# Patient Record
Sex: Female | Born: 1985 | Race: White | Hispanic: No | Marital: Married | Smoking: Never smoker
Health system: Southern US, Community
[De-identification: ages and names within clinical notes are randomized; demographics above are authoritative.]

## PROBLEM LIST (undated history)

## (undated) DIAGNOSIS — Z789 Other specified health status: Secondary | ICD-10-CM

## (undated) DIAGNOSIS — F419 Anxiety disorder, unspecified: Secondary | ICD-10-CM

## (undated) HISTORY — PX: NO PAST SURGERIES: SHX2092

---

## 2013-10-29 NOTE — L&D Delivery Note (Signed)
Delivery Note Patient pushed for 3 hours and at 12:37 AM a viable and healthy female was delivered via Vaginal, Spontaneous Delivery (Presentation: Right Occiput Anterior).  APGAR: 3, 9;  Weight 6lbs 10oz Placenta status: spontaneous, intact , .  Cord: 3 vessels with the following complications: None.  Cord pH: drawn and pending d/t baby being floppy immediately post partum. Peds present At delivery.  Anesthesia: Epidural  Episiotomy: None Lacerations: 1st degree Suture Repair: 2.0 vicryl Est. Blood Loss (mL): 400  Mom to postpartum.  Baby to Couplet care / Skin to Skin.  Katelyn Peters, Katelyn Peters [name] 10/06/2014, 1:05 AM

## 2014-04-02 ENCOUNTER — Other Ambulatory Visit: Payer: Self-pay

## 2014-04-02 LAB — OB RESULTS CONSOLE ABO/RH: RH Type: POSITIVE

## 2014-04-02 LAB — OB RESULTS CONSOLE RUBELLA ANTIBODY, IGM: RUBELLA: IMMUNE

## 2014-04-02 LAB — OB RESULTS CONSOLE HIV ANTIBODY (ROUTINE TESTING): HIV: NONREACTIVE

## 2014-04-02 LAB — OB RESULTS CONSOLE ANTIBODY SCREEN: Antibody Screen: NEGATIVE

## 2014-04-02 LAB — OB RESULTS CONSOLE HEPATITIS B SURFACE ANTIGEN: HEP B S AG: NEGATIVE

## 2014-04-02 LAB — OB RESULTS CONSOLE RPR: RPR: NONREACTIVE

## 2014-04-02 LAB — OB RESULTS CONSOLE GC/CHLAMYDIA
CHLAMYDIA, DNA PROBE: NEGATIVE
Gonorrhea: NEGATIVE

## 2014-05-13 ENCOUNTER — Other Ambulatory Visit (HOSPITAL_COMMUNITY): Payer: Self-pay | Admitting: Obstetrics and Gynecology

## 2014-05-13 DIAGNOSIS — IMO0002 Reserved for concepts with insufficient information to code with codable children: Secondary | ICD-10-CM

## 2014-05-13 DIAGNOSIS — Z0489 Encounter for examination and observation for other specified reasons: Secondary | ICD-10-CM

## 2014-05-21 ENCOUNTER — Ambulatory Visit (HOSPITAL_COMMUNITY)
Admission: RE | Admit: 2014-05-21 | Discharge: 2014-05-21 | Disposition: A | Discharge status: Home / Self Care | Payer: 59 | Source: Ambulatory Visit | Attending: Obstetrics and Gynecology | Admitting: Obstetrics and Gynecology

## 2014-05-21 DIAGNOSIS — IMO0002 Reserved for concepts with insufficient information to code with codable children: Secondary | ICD-10-CM

## 2014-05-21 DIAGNOSIS — Z1389 Encounter for screening for other disorder: Secondary | ICD-10-CM | POA: Insufficient documentation

## 2014-05-21 DIAGNOSIS — Z363 Encounter for antenatal screening for malformations: Secondary | ICD-10-CM | POA: Insufficient documentation

## 2014-05-21 DIAGNOSIS — Z0489 Encounter for examination and observation for other specified reasons: Secondary | ICD-10-CM

## 2014-05-25 ENCOUNTER — Other Ambulatory Visit (HOSPITAL_COMMUNITY): Payer: Self-pay | Admitting: Obstetrics and Gynecology

## 2014-05-25 ENCOUNTER — Encounter (HOSPITAL_COMMUNITY): Payer: Self-pay | Admitting: Obstetrics and Gynecology

## 2014-05-25 DIAGNOSIS — O10912 Unspecified pre-existing hypertension complicating pregnancy, second trimester: Secondary | ICD-10-CM

## 2014-05-25 DIAGNOSIS — O99212 Obesity complicating pregnancy, second trimester: Principal | ICD-10-CM

## 2014-07-02 ENCOUNTER — Ambulatory Visit (HOSPITAL_COMMUNITY): Payer: 59

## 2014-07-06 ENCOUNTER — Ambulatory Visit (HOSPITAL_COMMUNITY)
Admission: RE | Admit: 2014-07-06 | Discharge: 2014-07-06 | Disposition: A | Discharge status: Home / Self Care | Payer: 59 | Source: Ambulatory Visit | Attending: Obstetrics and Gynecology | Admitting: Obstetrics and Gynecology

## 2014-07-06 ENCOUNTER — Encounter (HOSPITAL_COMMUNITY): Payer: Self-pay

## 2014-07-06 DIAGNOSIS — O10912 Unspecified pre-existing hypertension complicating pregnancy, second trimester: Secondary | ICD-10-CM

## 2014-07-06 DIAGNOSIS — O99212 Obesity complicating pregnancy, second trimester: Secondary | ICD-10-CM

## 2014-07-06 DIAGNOSIS — E669 Obesity, unspecified: Secondary | ICD-10-CM | POA: Insufficient documentation

## 2014-07-06 DIAGNOSIS — O99210 Obesity complicating pregnancy, unspecified trimester: Secondary | ICD-10-CM | POA: Diagnosis not present

## 2014-07-06 DIAGNOSIS — Z3689 Encounter for other specified antenatal screening: Secondary | ICD-10-CM | POA: Insufficient documentation

## 2014-07-06 DIAGNOSIS — O10019 Pre-existing essential hypertension complicating pregnancy, unspecified trimester: Secondary | ICD-10-CM | POA: Insufficient documentation

## 2014-08-18 ENCOUNTER — Ambulatory Visit (HOSPITAL_COMMUNITY): Payer: 59

## 2014-08-20 ENCOUNTER — Ambulatory Visit (HOSPITAL_COMMUNITY)
Admission: RE | Admit: 2014-08-20 | Discharge: 2014-08-20 | Disposition: A | Discharge status: Home / Self Care | Payer: 59 | Source: Ambulatory Visit | Attending: Obstetrics and Gynecology | Admitting: Obstetrics and Gynecology

## 2014-08-20 VITALS — BP 118/72 | HR 103 | Wt 256.2 lb

## 2014-08-20 DIAGNOSIS — Z36 Encounter for antenatal screening of mother: Secondary | ICD-10-CM | POA: Insufficient documentation

## 2014-08-20 DIAGNOSIS — O99210 Obesity complicating pregnancy, unspecified trimester: Secondary | ICD-10-CM

## 2014-08-20 DIAGNOSIS — O99213 Obesity complicating pregnancy, third trimester: Secondary | ICD-10-CM | POA: Insufficient documentation

## 2014-08-20 DIAGNOSIS — Z3A32 32 weeks gestation of pregnancy: Secondary | ICD-10-CM | POA: Diagnosis not present

## 2014-08-20 DIAGNOSIS — IMO0002 Reserved for concepts with insufficient information to code with codable children: Secondary | ICD-10-CM

## 2014-08-20 DIAGNOSIS — Z0489 Encounter for examination and observation for other specified reasons: Secondary | ICD-10-CM

## 2014-08-31 ENCOUNTER — Encounter (HOSPITAL_COMMUNITY): Payer: Self-pay

## 2014-09-09 LAB — OB RESULTS CONSOLE GBS: GBS: NEGATIVE

## 2014-09-21 ENCOUNTER — Inpatient Hospital Stay (HOSPITAL_COMMUNITY)
Admission: AD | Admit: 2014-09-21 | Discharge: 2014-09-21 | Disposition: A | Discharge status: Home / Self Care | Payer: 59 | Source: Ambulatory Visit | Attending: Obstetrics and Gynecology | Admitting: Obstetrics and Gynecology

## 2014-09-21 ENCOUNTER — Encounter (HOSPITAL_COMMUNITY): Payer: Self-pay | Admitting: General Practice

## 2014-09-21 DIAGNOSIS — O133 Gestational [pregnancy-induced] hypertension without significant proteinuria, third trimester: Secondary | ICD-10-CM | POA: Diagnosis present

## 2014-09-21 DIAGNOSIS — Z3A37 37 weeks gestation of pregnancy: Secondary | ICD-10-CM | POA: Diagnosis not present

## 2014-09-21 HISTORY — DX: Anxiety disorder, unspecified: F41.9

## 2014-09-21 HISTORY — DX: Other specified health status: Z78.9

## 2014-09-21 LAB — PROTEIN / CREATININE RATIO, URINE
CREATININE, URINE: 43.52 mg/dL
PROTEIN CREATININE RATIO: 0.09 (ref 0.00–0.15)
Total Protein, Urine: 4 mg/dL

## 2014-09-21 LAB — COMPREHENSIVE METABOLIC PANEL
ALBUMIN: 2.5 g/dL — AB (ref 3.5–5.2)
ALT: 16 U/L (ref 0–35)
ANION GAP: 14 (ref 5–15)
AST: 18 U/L (ref 0–37)
Alkaline Phosphatase: 148 U/L — ABNORMAL HIGH (ref 39–117)
BUN: 10 mg/dL (ref 6–23)
CALCIUM: 8.7 mg/dL (ref 8.4–10.5)
CO2: 19 mEq/L (ref 19–32)
Chloride: 104 mEq/L (ref 96–112)
Creatinine, Ser: 0.63 mg/dL (ref 0.50–1.10)
GFR calc non Af Amer: >90 mL/min (ref >90)
GLUCOSE: 74 mg/dL (ref 70–99)
Potassium: 4 mEq/L (ref 3.7–5.3)
Sodium: 137 mEq/L (ref 137–147)
TOTAL PROTEIN: 6.7 g/dL (ref 6.0–8.3)
Total Bilirubin: <0.2 mg/dL — ABNORMAL LOW (ref 0.3–1.2)

## 2014-09-21 LAB — CBC
HEMATOCRIT: 36 % (ref 36.0–46.0)
HEMOGLOBIN: 11.5 g/dL — AB (ref 12.0–15.0)
MCH: 26.4 pg (ref 26.0–34.0)
MCHC: 31.9 g/dL (ref 30.0–36.0)
MCV: 82.6 fL (ref 78.0–100.0)
Platelets: 275 10*3/uL (ref 150–400)
RBC: 4.36 MIL/uL (ref 3.87–5.11)
RDW: 16.2 % — AB (ref 11.5–15.5)
WBC: 7.9 10*3/uL (ref 4.0–10.5)

## 2014-09-21 LAB — URINALYSIS, ROUTINE W REFLEX MICROSCOPIC
BILIRUBIN URINE: NEGATIVE
GLUCOSE, UA: NEGATIVE mg/dL
Hgb urine dipstick: NEGATIVE
KETONES UR: NEGATIVE mg/dL
LEUKOCYTES UA: NEGATIVE
NITRITE: NEGATIVE
PH: 6 (ref 5.0–8.0)
Protein, ur: NEGATIVE mg/dL
Specific Gravity, Urine: <1.005 — ABNORMAL LOW (ref 1.005–1.030)
Urobilinogen, UA: 0.2 mg/dL (ref 0.0–1.0)

## 2014-09-21 NOTE — Discharge Instructions (Signed)

## 2014-09-21 NOTE — MAU Note (Signed)
Patient states she was in the office today and had elevated blood pressure. Sent to MAU for further evaluation. Denies bleeding, leaking or contractions. Reports good fetal movement.

## 2014-09-21 NOTE — MAU Note (Signed)
Urine in lab 

## 2014-09-21 NOTE — MAU Provider Note (Signed)
Chief Complaint:  Hypertension  First Provider Initiated Contact with Patient 09/21/14 1653     HPI: Katelyn Peters is a 28 y.o. G1P0 at 5857w0d who was sent [facility] maternity admissions [facility] for preeclampsia workup. Blood pressure elevated in the office today. Denies headache, vision changes, epigastric pain, leaking of fluid or vaginal bleeding. Mild contractions. Positive fetal movement.   Pregnancy Course: Obesity, anxiety, BP elevated at NOB, but normal throughout pregnancy until ~this week.  Past Medical History: Past Medical History  Diagnosis Date  . Medical history non-contributory   . Anxiety     Past obstetric history: OB History  Gravida Para Term Preterm AB SAB TAB Ectopic Multiple Living  1         0    # Outcome Date GA Lbr Len/2nd Weight Sex Delivery Anes PTL Lv  1 Current               Past Surgical History: History reviewed. No pertinent past surgical history.   Family History: History reviewed. No pertinent family history.  Social History: History  Substance Use Topics  . Smoking status: Never Smoker   . Smokeless tobacco: Never Used  . Alcohol Use: Yes     Comment: occasional social drinking    Allergies: No Known Allergies  Meds:  Prescriptions prior to admission  Medication Sig Dispense Refill Last Dose  . acetaminophen (TYLENOL) 500 MG tablet Take 1,000 mg by mouth every 6 (six) hours as needed for moderate pain.   09/20/2014 at 2200  . calcium carbonate (TUMS - DOSED IN MG ELEMENTAL CALCIUM) 500 MG chewable tablet Chew 1 tablet by mouth daily as needed for heartburn.   09/20/2014 at 2200  . Prenatal Vit-Fe Fumarate-FA (PRENATAL MULTIVITAMIN) TABS tablet Take 1 tablet by mouth daily at 12 noon.   09/20/2014 at 2200  . sodium chloride (OCEAN) 0.65 % SOLN nasal spray Place 1 spray into both nostrils as needed for congestion.   09/21/2014 at 1300    ROS: Pertinent findings in history of present illness.  Physical Exam  Blood  pressure 109/68, pulse 80, height 5' 3.5" (1.613 m), weight 121.292 kg (267 lb 6.4 oz), last menstrual period 12/30/2013. GENERAL: Well-developed, well-nourished female in no acute distress.  HEENT: normocephalic HEART: normal rate RESP: normal effort ABDOMEN: Soft, non-tender, gravid appropriate for gestational age EXTREMITIES: Nontender, no edema NEURO: alert and oriented SPECULUM EXAM: Deferred     FHT:  Baseline 130 , moderate variability, accelerations present, no decelerations Contractions: irreg, mild   Labs: Results for orders placed or performed during the hospital encounter of 09/21/14 (from the past 24 hour(s))  Urinalysis, Routine w reflex microscopic     Status: Abnormal   Collection Time: 09/21/14  4:15 PM  Result Value Ref Range   Color, Urine YELLOW YELLOW   APPearance HAZY (A) CLEAR   Specific Gravity, Urine <1.005 (L) 1.005 - 1.030   pH 6.0 5.0 - 8.0   Glucose, UA NEGATIVE NEGATIVE mg/dL   Hgb urine dipstick NEGATIVE NEGATIVE   Bilirubin Urine NEGATIVE NEGATIVE   Ketones, ur NEGATIVE NEGATIVE mg/dL   Protein, ur NEGATIVE NEGATIVE mg/dL   Urobilinogen, UA 0.2 0.0 - 1.0 mg/dL   Nitrite NEGATIVE NEGATIVE   Leukocytes, UA NEGATIVE NEGATIVE  Protein / creatinine ratio, urine     Status: None   Collection Time: 09/21/14  4:15 PM  Result Value Ref Range   Creatinine, Urine 43.52 mg/dL   Total Protein, Urine 4 mg/dL  Protein Creatinine Ratio 0.09 0.00 - 0.15  CBC     Status: Abnormal   Collection Time: 09/21/14  5:01 PM  Result Value Ref Range   WBC 7.9 4.0 - 10.5 K/uL   RBC 4.36 3.87 - 5.11 MIL/uL   Hemoglobin 11.5 (L) 12.0 - 15.0 g/dL   HCT 16.136.0 09.636.0 - 04.546.0 %   MCV 82.6 78.0 - 100.0 fL   MCH 26.4 26.0 - 34.0 pg   MCHC 31.9 30.0 - 36.0 g/dL   RDW 40.916.2 (H) 81.111.5 - 91.415.5 %   Platelets 275 150 - 400 K/uL  Comprehensive metabolic panel     Status: Abnormal   Collection Time: 09/21/14  5:01 PM  Result Value Ref Range   Sodium 137 137 - 147 mEq/L   Potassium  4.0 3.7 - 5.3 mEq/L   Chloride 104 96 - 112 mEq/L   CO2 19 19 - 32 mEq/L   Glucose, Bld 74 70 - 99 mg/dL   BUN 10 6 - 23 mg/dL   Creatinine, Ser 7.820.63 0.50 - 1.10 mg/dL   Calcium 8.7 8.4 - 95.610.5 mg/dL   Total Protein 6.7 6.0 - 8.3 g/dL   Albumin 2.5 (L) 3.5 - 5.2 g/dL   AST 18 0 - 37 U/L   ALT 16 0 - 35 U/L   Alkaline Phosphatase 148 (H) 39 - 117 U/L   Total Bilirubin <0.2 (L) 0.3 - 1.2 mg/dL   GFR calc non Af Amer >90 >90 mL/min   GFR calc Af Amer >90 >90 mL/min   Anion gap 14 5 - 15    Imaging:  No results found.   MAU Course:  Assessment: 1. Gestational hypertension, third trimester    Plan: Discharge home in stable condition per consult w/ Dr. Henderson CloudHorvath. Pre-E precautions. Labor precautions and fetal kick counts     Follow-up Information    Follow up with Annemarie Sebree A, MD. Schedule an appointment as soon as possible for a visit in 1 week.   Specialty:  Obstetrics and Gynecology   Why:  for Monday or Tuesday next week    Contact information:   [address] [place] [id number] [phone]       [facility] with [facility].   Why:  As needed in emergencies   Contact information:   [address] [id number] mc [place] [id number] [phone]       Medication List    TAKE these medications        acetaminophen 500 MG tablet  Commonly known as:  TYLENOL  Take 1,000 mg by mouth every 6 (six) hours as needed for moderate pain.     calcium carbonate 500 MG chewable tablet  Commonly known as:  TUMS - dosed in mg elemental calcium  Chew 1 tablet by mouth daily as needed for heartburn.     prenatal multivitamin Tabs tablet  Take 1 tablet by mouth daily at 12 noon.     sodium chloride 0.65 % Soln nasal spray  Commonly known as:  OCEAN  Place 1 spray into both nostrils as needed for congestion.        LyonsVirginia Smith, CNM 09/21/2014 6:40 PM

## 2014-10-01 ENCOUNTER — Inpatient Hospital Stay (HOSPITAL_COMMUNITY)
Admission: AD | Admit: 2014-10-01 | Discharge: 2014-10-01 | Disposition: A | Discharge status: Home / Self Care | Payer: 59 | Source: Ambulatory Visit | Attending: Obstetrics and Gynecology | Admitting: Obstetrics and Gynecology

## 2014-10-01 ENCOUNTER — Encounter (HOSPITAL_COMMUNITY): Payer: Self-pay | Admitting: *Deleted

## 2014-10-01 DIAGNOSIS — O133 Gestational [pregnancy-induced] hypertension without significant proteinuria, third trimester: Secondary | ICD-10-CM

## 2014-10-01 DIAGNOSIS — Z3A38 38 weeks gestation of pregnancy: Secondary | ICD-10-CM | POA: Insufficient documentation

## 2014-10-01 DIAGNOSIS — R030 Elevated blood-pressure reading, without diagnosis of hypertension: Secondary | ICD-10-CM | POA: Diagnosis present

## 2014-10-01 LAB — LACTATE DEHYDROGENASE: LDH: 210 U/L (ref 94–250)

## 2014-10-01 LAB — CBC WITH DIFFERENTIAL/PLATELET
BASOS ABS: 0 10*3/uL (ref 0.0–0.1)
Basophils Relative: 0 % (ref 0–1)
EOS ABS: 0 10*3/uL (ref 0.0–0.7)
Eosinophils Relative: 0 % (ref 0–5)
HCT: 36.4 % (ref 36.0–46.0)
HEMOGLOBIN: 11.5 g/dL — AB (ref 12.0–15.0)
Lymphocytes Relative: 19 % (ref 12–46)
Lymphs Abs: 1.6 10*3/uL (ref 0.7–4.0)
MCH: 26.6 pg (ref 26.0–34.0)
MCHC: 31.6 g/dL (ref 30.0–36.0)
MCV: 84.1 fL (ref 78.0–100.0)
MONOS PCT: 9 % (ref 3–12)
Monocytes Absolute: 0.8 10*3/uL (ref 0.1–1.0)
NEUTROS PCT: 72 % (ref 43–77)
Neutro Abs: 6.3 10*3/uL (ref 1.7–7.7)
Platelets: 265 10*3/uL (ref 150–400)
RBC: 4.33 MIL/uL (ref 3.87–5.11)
RDW: 16.8 % — AB (ref 11.5–15.5)
WBC: 8.7 10*3/uL (ref 4.0–10.5)

## 2014-10-01 LAB — URINALYSIS, ROUTINE W REFLEX MICROSCOPIC
Bilirubin Urine: NEGATIVE
Glucose, UA: NEGATIVE mg/dL
KETONES UR: NEGATIVE mg/dL
NITRITE: NEGATIVE
Protein, ur: NEGATIVE mg/dL
Specific Gravity, Urine: <1.005 — ABNORMAL LOW (ref 1.005–1.030)
UROBILINOGEN UA: 0.2 mg/dL (ref 0.0–1.0)
pH: 6 (ref 5.0–8.0)

## 2014-10-01 LAB — URIC ACID: Uric Acid, Serum: 6.5 mg/dL (ref 2.4–7.0)

## 2014-10-01 LAB — COMPREHENSIVE METABOLIC PANEL
ALBUMIN: 2.7 g/dL — AB (ref 3.5–5.2)
ALT: 13 U/L (ref 0–35)
ANION GAP: 13 (ref 5–15)
AST: 19 U/L (ref 0–37)
Alkaline Phosphatase: 159 U/L — ABNORMAL HIGH (ref 39–117)
BUN: 9 mg/dL (ref 6–23)
CO2: 21 mEq/L (ref 19–32)
CREATININE: 0.75 mg/dL (ref 0.50–1.10)
Calcium: 9.2 mg/dL (ref 8.4–10.5)
Chloride: 101 mEq/L (ref 96–112)
GFR calc Af Amer: >90 mL/min (ref >90)
GFR calc non Af Amer: >90 mL/min (ref >90)
Glucose, Bld: 70 mg/dL (ref 70–99)
Potassium: 4.2 mEq/L (ref 3.7–5.3)
Sodium: 135 mEq/L — ABNORMAL LOW (ref 137–147)
Total Bilirubin: <0.2 mg/dL — ABNORMAL LOW (ref 0.3–1.2)
Total Protein: 6.9 g/dL (ref 6.0–8.3)

## 2014-10-01 LAB — URINE MICROSCOPIC-ADD ON

## 2014-10-01 LAB — PROTEIN / CREATININE RATIO, URINE
CREATININE, URINE: 58.58 mg/dL
Protein Creatinine Ratio: 0.08 (ref 0.00–0.15)
Total Protein, Urine: 4.9 mg/dL

## 2014-10-01 NOTE — MAU Note (Signed)
Urine in lab 

## 2014-10-01 NOTE — MAU Provider Note (Signed)
History     CSN: [id number]  Arrival date and time: 10/01/14 1040   First Provider Initiated Contact with Patient 10/01/14 1140      Chief Complaint  Patient presents with  . Hypertension  . Headache   HPI Katelyn Peters is 28 y.o. G1P0 3960w3d weeks presenting with elevated blood pressure.  Seen in the office this am by Dr. Henderson CloudHorvath, sent here for labs.   She reports "last 4 visits in the office, my blood pressure was high".  [facility]  She is not taking Rxs for elevated blood.  She reports fetal movement. Has had a headache X 4 days, tylenol helps.  Mild edema in feet.  Neg for chest pain.  Last dose of Tylenol was on her way here.   Past Medical History  Diagnosis Date  . Medical history non-contributory   . Anxiety     Past Surgical History  Procedure Laterality Date  . No past surgeries      History reviewed. No pertinent family history.  History  Substance Use Topics  . Smoking status: Never Smoker   . Smokeless tobacco: Never Used  . Alcohol Use: No     Comment: occasional social drinking    Allergies: No Known Allergies  Prescriptions prior to admission  Medication Sig Dispense Refill Last Dose  . acetaminophen (TYLENOL) 500 MG tablet Take 1,000 mg by mouth every 6 (six) hours as needed for moderate pain.   10/01/2014 at Unknown time  . calcium carbonate (TUMS - DOSED IN MG ELEMENTAL CALCIUM) 500 MG chewable tablet Chew 1 tablet by mouth daily as needed for heartburn.   10/01/2014 at Unknown time  . Prenatal Vit-Fe Fumarate-FA (PRENATAL MULTIVITAMIN) TABS tablet Take 1 tablet by mouth daily at 12 noon.   Past Week at Unknown time  . sodium chloride (OCEAN) 0.65 % SOLN nasal spray Place 1 spray into both nostrils as needed for congestion.   10/01/2014 at Unknown time    Review of Systems  Constitutional: Negative for fever and chills.  Cardiovascular: Negative for chest pain.       Mild pedal edema  Genitourinary:       Neg for vaginal  bleeding or LOF.    Neurological: Positive for headaches.   Physical Exam   Blood pressure 125/79, pulse 87, temperature 98.3 F (36.8 C), temperature source Oral, resp. rate 18, last menstrual period 12/30/2013, SpO2 95 %.  Physical Exam  Constitutional: She is oriented to person, place, and time. She appears well-developed and well-nourished. No distress.  HENT:  Head: Normocephalic.  Cardiovascular: Normal rate.   Respiratory: Effort normal.  Musculoskeletal: She exhibits edema (bilaterally mild pedal edema without pitting).  Neurological: She is alert and oriented to person, place, and time.  Skin: Skin is warm and dry.  Psychiatric: She has a normal mood and affect. Her behavior is normal.   Results for orders placed or performed during the hospital encounter of 10/01/14 (from the past 24 hour(s))  Urinalysis, Routine w reflex microscopic     Status: Abnormal   Collection Time: 10/01/14 11:00 AM  Result Value Ref Range   Color, Urine YELLOW YELLOW   APPearance HAZY (A) CLEAR   Specific Gravity, Urine <1.005 (L) 1.005 - 1.030   pH 6.0 5.0 - 8.0   Glucose, UA NEGATIVE NEGATIVE mg/dL   Hgb urine dipstick MODERATE (A) NEGATIVE   Bilirubin Urine NEGATIVE NEGATIVE   Ketones, ur NEGATIVE NEGATIVE mg/dL  Protein, ur NEGATIVE NEGATIVE mg/dL   Urobilinogen, UA 0.2 0.0 - 1.0 mg/dL   Nitrite NEGATIVE NEGATIVE   Leukocytes, UA SMALL (A) NEGATIVE  Protein / creatinine ratio, urine     Status: None   Collection Time: 10/01/14 11:00 AM  Result Value Ref Range   Creatinine, Urine 58.58 mg/dL   Total Protein, Urine 4.9 mg/dL   Protein Creatinine Ratio 0.08 0.00 - 0.15  Urine microscopic-add on     Status: Abnormal   Collection Time: 10/01/14 11:00 AM  Result Value Ref Range   Squamous Epithelial / LPF MANY (A) RARE   WBC, UA 0-2 <3 WBC/hpf   Bacteria, UA FEW (A) RARE  CBC with Differential     Status: Abnormal   Collection Time: 10/01/14 11:56 AM  Result Value Ref Range   WBC  8.7 4.0 - 10.5 K/uL   RBC 4.33 3.87 - 5.11 MIL/uL   Hemoglobin 11.5 (L) 12.0 - 15.0 g/dL   HCT 16.136.4 09.636.0 - 04.546.0 %   MCV 84.1 78.0 - 100.0 fL   MCH 26.6 26.0 - 34.0 pg   MCHC 31.6 30.0 - 36.0 g/dL   RDW 40.916.8 (H) 81.111.5 - 91.415.5 %   Platelets 265 150 - 400 K/uL   Neutrophils Relative % 72 43 - 77 %   Neutro Abs 6.3 1.7 - 7.7 K/uL   Lymphocytes Relative 19 12 - 46 %   Lymphs Abs 1.6 0.7 - 4.0 K/uL   Monocytes Relative 9 3 - 12 %   Monocytes Absolute 0.8 0.1 - 1.0 K/uL   Eosinophils Relative 0 0 - 5 %   Eosinophils Absolute 0.0 0.0 - 0.7 K/uL   Basophils Relative 0 0 - 1 %   Basophils Absolute 0.0 0.0 - 0.1 K/uL  Comprehensive metabolic panel     Status: Abnormal   Collection Time: 10/01/14 11:56 AM  Result Value Ref Range   Sodium 135 (L) 137 - 147 mEq/L   Potassium 4.2 3.7 - 5.3 mEq/L   Chloride 101 96 - 112 mEq/L   CO2 21 19 - 32 mEq/L   Glucose, Bld 70 70 - 99 mg/dL   BUN 9 6 - 23 mg/dL   Creatinine, Ser 7.820.75 0.50 - 1.10 mg/dL   Calcium 9.2 8.4 - 95.610.5 mg/dL   Total Protein 6.9 6.0 - 8.3 g/dL   Albumin 2.7 (L) 3.5 - 5.2 g/dL   AST 19 0 - 37 U/L   ALT 13 0 - 35 U/L   Alkaline Phosphatase 159 (H) 39 - 117 U/L   Total Bilirubin <0.2 (L) 0.3 - 1.2 mg/dL   GFR calc non Af Amer >90 >90 mL/min   GFR calc Af Amer >90 >90 mL/min   Anion gap 13 5 - 15  Lactate dehydrogenase     Status: None   Collection Time: 10/01/14 11:56 AM  Result Value Ref Range   LDH 210 94 - 250 U/L  Uric acid     Status: None   Collection Time: 10/01/14 11:56 AM  Result Value Ref Range   Uric Acid, Serum 6.5 2.4 - 7.0 mg/dL   Patient Vitals for the past 24 hrs:  BP Temp Temp src Pulse Resp SpO2  10/01/14 1445 125/79 mmHg 98.3 F (36.8 C) Oral 87 18 -  10/01/14 1430 127/82 mmHg - - 97 - -  10/01/14 1415 137/78 mmHg - - 87 - -  10/01/14 1400 127/88 mmHg - - 98 - -  10/01/14 1332 130/81 mmHg - -  102 - -  10/01/14 1318 117/78 mmHg - - 97 - -  10/01/14 1302 134/91 mmHg - - 87 - -  10/01/14 1248 129/90  mmHg - - 88 - -  10/01/14 1232 138/100 mmHg - - 92 - -  10/01/14 1218 144/85 mmHg - - 89 - -  10/01/14 1216 144/85 mmHg - - 89 - -  10/01/14 1203 149/88 mmHg - - 93 - -  10/01/14 1147 141/93 mmHg - - 97 - -  10/01/14 1132 137/95 mmHg - - 108 - -  10/01/14 1123 - - - 104 - 95 %  10/01/14 1118 142/94 mmHg - - 87 - 96 %  10/01/14 1116 (!) 146/101 mmHg 97.9 F (36.6 C) Oral 103 18 -   NST --baseline 130 reactive, mod variability.  Few contractions not noted by patient MAU Course  Procedures Headache as resolved at time of discharge.  MDM Dr. Henderson Cloud called to give orders.   13:48  Reported Labs, NST and Serial BPs to Dr. Henderson Cloud.  Continue to observe.  If any BPs with diastolic of > 100 will admit.  If not, discharge to home with rest, F/U on Monday in office  Assessment and Plan  A:  PIH [redacted]w[redacted]d      NST-reactive  P:  Discharge to home      REST over the weekend      Call for headache not responsive to tylenol, chest pain, visual changes or increased edema     Follow up in the office on Monday  KEY,EVE M 10/01/2014, 3:00 PM

## 2014-10-01 NOTE — Discharge Instructions (Signed)

## 2014-10-01 NOTE — MAU Note (Addendum)
Pt was seen in MD office today and sent over for elevated BP.  Pt has had headache x 3-4 days with little relief from tylenol.  Pt states she has had some vision changes, blurry at times.  Good fetal movement.  Pt was checked in office this morning.  Pt reports she is 1cm dilated.

## 2014-10-04 ENCOUNTER — Inpatient Hospital Stay (HOSPITAL_COMMUNITY)
Admission: AD | Admit: 2014-10-04 | Discharge: 2014-10-07 | DRG: 775 | Disposition: A | Discharge status: Home / Self Care | Payer: 59 | Source: Ambulatory Visit | Attending: Obstetrics & Gynecology | Admitting: Obstetrics & Gynecology

## 2014-10-04 ENCOUNTER — Encounter (HOSPITAL_COMMUNITY): Payer: Self-pay | Admitting: *Deleted

## 2014-10-04 ENCOUNTER — Inpatient Hospital Stay (HOSPITAL_COMMUNITY)
Admission: RE | Admit: 2014-10-04 | Discharge: 2014-10-04 | Disposition: A | Discharge status: Home / Self Care | Payer: 59 | Source: Ambulatory Visit | Attending: Obstetrics and Gynecology | Admitting: Obstetrics and Gynecology

## 2014-10-04 DIAGNOSIS — Z3403 Encounter for supervision of normal first pregnancy, third trimester: Secondary | ICD-10-CM | POA: Diagnosis present

## 2014-10-04 DIAGNOSIS — IMO0001 Reserved for inherently not codable concepts without codable children: Secondary | ICD-10-CM

## 2014-10-04 DIAGNOSIS — O169 Unspecified maternal hypertension, unspecified trimester: Secondary | ICD-10-CM | POA: Diagnosis present

## 2014-10-04 DIAGNOSIS — O133 Gestational [pregnancy-induced] hypertension without significant proteinuria, third trimester: Secondary | ICD-10-CM | POA: Diagnosis present

## 2014-10-04 DIAGNOSIS — Z3A39 39 weeks gestation of pregnancy: Secondary | ICD-10-CM | POA: Diagnosis present

## 2014-10-04 LAB — OB RESULTS CONSOLE ABO/RH: RH TYPE: NEGATIVE

## 2014-10-04 LAB — TYPE AND SCREEN
ABO/RH(D): O NEG
Antibody Screen: NEGATIVE

## 2014-10-04 MED ORDER — LACTATED RINGERS IV SOLN: 500.0000 mL | INTRAVENOUS | Status: DC | PRN

## 2014-10-04 MED ORDER — OXYCODONE-ACETAMINOPHEN 5-325 MG PO TABS: 1.0000 | ORAL_TABLET | ORAL | Status: DC | PRN

## 2014-10-04 MED ORDER — ONDANSETRON HCL 4 MG/2ML IJ SOLN: 4.0000 mg | Freq: Four times a day (QID) | INTRAMUSCULAR | Status: DC | PRN

## 2014-10-04 MED ORDER — MISOPROSTOL 25 MCG QUARTER TABLET
25.0000 ug | ORAL_TABLET | ORAL | Status: DC | PRN
  Administered 2014-10-04 – 2014-10-05 (×3): 25 ug via VAGINAL
  Filled 2014-10-04: qty 0.25
  Filled 2014-10-04: qty 1
  Filled 2014-10-04 (×2): qty 0.25

## 2014-10-04 MED ORDER — OXYTOCIN BOLUS FROM INFUSION
500.0000 mL | INTRAVENOUS | Status: DC
  Administered 2014-10-06: 500 mL via INTRAVENOUS

## 2014-10-04 MED ORDER — LACTATED RINGERS IV SOLN
500.0000 mL | INTRAVENOUS | Status: DC | PRN
  Administered 2014-10-05: 500 mL via INTRAVENOUS

## 2014-10-04 MED ORDER — CITRIC ACID-SODIUM CITRATE 334-500 MG/5ML PO SOLN: 30.0000 mL | ORAL | Status: DC | PRN

## 2014-10-04 MED ORDER — LACTATED RINGERS IV SOLN
INTRAVENOUS | Status: DC
  Administered 2014-10-04 – 2014-10-05 (×4): via INTRAVENOUS

## 2014-10-04 MED ORDER — LIDOCAINE HCL (PF) 1 % IJ SOLN
30.0000 mL | INTRAMUSCULAR | Status: DC | PRN
  Administered 2014-10-06: 30 mL via SUBCUTANEOUS
  Filled 2014-10-04: qty 30

## 2014-10-04 MED ORDER — FLEET ENEMA 7-19 GM/118ML RE ENEM: 1.0000 | ENEMA | Freq: Every day | RECTAL | Status: DC | PRN

## 2014-10-04 MED ORDER — OXYTOCIN 40 UNITS IN LACTATED RINGERS INFUSION - SIMPLE MED: 62.5000 mL/h | INTRAVENOUS | Status: DC

## 2014-10-04 MED ORDER — TERBUTALINE SULFATE 1 MG/ML IJ SOLN: 0.2500 mg | Freq: Once | INTRAMUSCULAR | Status: AC | PRN

## 2014-10-04 MED ORDER — BUTORPHANOL TARTRATE 1 MG/ML IJ SOLN
1.0000 mg | INTRAMUSCULAR | Status: DC | PRN
  Administered 2014-10-05: 1 mg via INTRAVENOUS
  Filled 2014-10-04: qty 1

## 2014-10-04 MED ORDER — SALINE SPRAY 0.65 % NA SOLN
1.0000 | NASAL | Status: DC | PRN
  Filled 2014-10-04: qty 44

## 2014-10-04 MED ORDER — LACTATED RINGERS IV SOLN: INTRAVENOUS | Status: DC

## 2014-10-04 MED ORDER — ACETAMINOPHEN 325 MG PO TABS
650.0000 mg | ORAL_TABLET | ORAL | Status: DC | PRN
  Administered 2014-10-04: 650 mg via ORAL
  Filled 2014-10-04: qty 2

## 2014-10-04 NOTE — Plan of Care (Signed)
Problem: Consults Goal: Birthing Suites Patient Information Press F2 to bring up selections list Outcome: Completed/Met Date Met:  10/04/14  Pt 37-[redacted] weeks EGA and Inpatient induction

## 2014-10-05 ENCOUNTER — Inpatient Hospital Stay (HOSPITAL_COMMUNITY): Payer: 59 | Admitting: Anesthesiology

## 2014-10-05 ENCOUNTER — Encounter (HOSPITAL_COMMUNITY): Payer: Self-pay | Admitting: *Deleted

## 2014-10-05 ENCOUNTER — Inpatient Hospital Stay (HOSPITAL_COMMUNITY): Payer: 59

## 2014-10-05 LAB — RPR

## 2014-10-05 LAB — CBC
HCT: 35.8 % — ABNORMAL LOW (ref 36.0–46.0)
HEMATOCRIT: 36.2 % (ref 36.0–46.0)
Hemoglobin: 11.6 g/dL — ABNORMAL LOW (ref 12.0–15.0)
Hemoglobin: 11.7 g/dL — ABNORMAL LOW (ref 12.0–15.0)
MCH: 27.2 pg (ref 26.0–34.0)
MCH: 26.9 pg (ref 26.0–34.0)
MCHC: 32.4 g/dL (ref 30.0–36.0)
MCHC: 32.3 g/dL (ref 30.0–36.0)
MCV: 83.8 fL (ref 78.0–100.0)
MCV: 83.2 fL (ref 78.0–100.0)
PLATELETS: 256 10*3/uL (ref 150–400)
Platelets: 252 10*3/uL (ref 150–400)
RBC: 4.27 MIL/uL (ref 3.87–5.11)
RBC: 4.35 MIL/uL (ref 3.87–5.11)
RDW: 16.7 % — ABNORMAL HIGH (ref 11.5–15.5)
RDW: 16.6 % — ABNORMAL HIGH (ref 11.5–15.5)
WBC: 8.8 10*3/uL (ref 4.0–10.5)
WBC: 9.7 10*3/uL (ref 4.0–10.5)

## 2014-10-05 LAB — ABO/RH: ABO/RH(D): O NEG

## 2014-10-05 MED ORDER — SODIUM CHLORIDE 0.9 % IV SOLN: Freq: Once | INTRAVENOUS | Status: DC

## 2014-10-05 MED ORDER — LIDOCAINE HCL (PF) 1 % IJ SOLN
INTRAMUSCULAR | Status: DC | PRN
  Administered 2014-10-05 (×4): 4 mL

## 2014-10-05 MED ORDER — PHENYLEPHRINE 40 MCG/ML (10ML) SYRINGE FOR IV PUSH (FOR BLOOD PRESSURE SUPPORT)
80.0000 ug | PREFILLED_SYRINGE | INTRAVENOUS | Status: DC | PRN
  Filled 2014-10-05: qty 2

## 2014-10-05 MED ORDER — PHENYLEPHRINE 40 MCG/ML (10ML) SYRINGE FOR IV PUSH (FOR BLOOD PRESSURE SUPPORT)
80.0000 ug | PREFILLED_SYRINGE | INTRAVENOUS | Status: DC | PRN
  Administered 2014-10-05 (×2): 80 ug via INTRAVENOUS
  Filled 2014-10-05: qty 2

## 2014-10-05 MED ORDER — OXYTOCIN 40 UNITS IN LACTATED RINGERS INFUSION - SIMPLE MED
1.0000 m[IU]/min | INTRAVENOUS | Status: DC
  Administered 2014-10-05 (×2): 2 m[IU]/min via INTRAVENOUS
  Filled 2014-10-05: qty 1000

## 2014-10-05 MED ORDER — BUPIVACAINE HCL (PF) 0.25 % IJ SOLN
INTRAMUSCULAR | Status: DC | PRN
  Administered 2014-10-05: 5 mL via EPIDURAL

## 2014-10-05 MED ORDER — PHENYLEPHRINE 40 MCG/ML (10ML) SYRINGE FOR IV PUSH (FOR BLOOD PRESSURE SUPPORT)
PREFILLED_SYRINGE | INTRAVENOUS | Status: AC
  Filled 2014-10-05: qty 10

## 2014-10-05 MED ORDER — EPHEDRINE 5 MG/ML INJ
10.0000 mg | INTRAVENOUS | Status: DC | PRN
  Filled 2014-10-05: qty 2

## 2014-10-05 MED ORDER — FENTANYL CITRATE 0.05 MG/ML IJ SOLN
100.0000 ug | Freq: Once | INTRAMUSCULAR | Status: AC
  Administered 2014-10-05: 100 ug via EPIDURAL
  Filled 2014-10-05: qty 2

## 2014-10-05 MED ORDER — FENTANYL 2.5 MCG/ML BUPIVACAINE 1/10 % EPIDURAL INFUSION (WH - ANES)
14.0000 mL/h | INTRAMUSCULAR | Status: DC | PRN
  Administered 2014-10-05 (×2): 14 mL/h via EPIDURAL
  Filled 2014-10-05: qty 125

## 2014-10-05 MED ORDER — DIPHENHYDRAMINE HCL 50 MG/ML IJ SOLN: 12.5000 mg | INTRAMUSCULAR | Status: DC | PRN

## 2014-10-05 MED ORDER — FENTANYL 2.5 MCG/ML BUPIVACAINE 1/10 % EPIDURAL INFUSION (WH - ANES)
INTRAMUSCULAR | Status: AC
  Administered 2014-10-05: 14 mL/h via EPIDURAL
  Filled 2014-10-05: qty 125

## 2014-10-05 MED ORDER — LACTATED RINGERS IV SOLN
500.0000 mL | Freq: Once | INTRAVENOUS | Status: AC
  Administered 2014-10-05: 500 mL via INTRAVENOUS

## 2014-10-05 NOTE — Plan of Care (Signed)
Problem: Consults Goal: Automotive [employer] Patient Education (See Patient Education module for education specifics.)  Outcome: Completed/Met Date Met:  10/05/14 Goal: Skin Care Protocol Initiated - if Braden Score 18 or less If consults are not indicated, leave blank or document N/A  Outcome: Completed/Met Date Met:  10/05/14 Goal: Prenatal labs/testing reviewed upon admission Outcome: Completed/Met Date Met:  10/05/14 Goal: Orientation to unit: Plan of Care Outcome: Completed/Met Date Met:  10/05/14 Goal: Orientation to unit: Room Outcome: Completed/Met Date Met:  10/05/14 Goal: Orientation to unit: [place] (smoking, visitation, chaplain services, helpline)  Outcome: Completed/Met Date Met:  10/05/14 Goal: Orientation to unit: Other (Specify with a note) Outcome: Not Applicable Date Met:  24/49/75  Problem: Phase I Progression Outcomes Goal: Assess per MD/Nurse,Routine-VS,FHR,UC,Head to Toe assess Outcome: Completed/Met Date Met:  10/05/14 Goal: Obtain and review prenatal records Outcome: Completed/Met Date Met:  10/05/14 Goal: Induction meds as ordered Outcome: Completed/Met Date Met:  10/05/14 Goal: Other Phase I Outcomes/Goals Outcome: Not Applicable Date Met:  30/05/11  Problem: Phase II Progression Outcomes Goal: Fetal monitoring per orders Outcome: Completed/Met Date Met:  10/05/14

## 2014-10-05 NOTE — Anesthesia Procedure Notes (Signed)
Epidural Patient location during procedure: OB Start time: 10/05/2014 3:05 PM  Staffing Performed by: other anesthesia staff   Preanesthetic Checklist Completed: patient identified, site marked, surgical consent, pre-op evaluation, timeout performed, IV checked, risks and benefits discussed and monitors and equipment checked  Epidural Patient position: sitting Prep: site prepped and draped and DuraPrep Patient monitoring: continuous pulse ox and blood pressure Approach: midline Location: L3-L4 Injection technique: LOR air  Needle:  Needle type: Tuohy  Needle gauge: 17 G Needle length: 9 cm and 9 Needle insertion depth: 8 cm Catheter type: closed end flexible Catheter size: 19 Gauge Catheter at skin depth: 13 cm Test dose: negative  Assessment Events: blood not aspirated, injection not painful, no injection resistance, negative IV test and no paresthesia  Additional Notes Discussed risk of headache, infection, bleeding, nerve injury and failed or incomplete block.  Patient voices understanding and wishes to proceed.  Epidural placed easily on first attempt by SRNA under direct supervision by me.  No paresthesia.  Patient tolerated procedure well with no apparent complications.  Jasmine DecemberA. Sherral Dirocco, MDReason for block:procedure for pain

## 2014-10-05 NOTE — Progress Notes (Signed)
Dr Mora ApplPinn called and made aware of continued complaints from patient of rectal pain and pressure. Vaginal exam remains the same.  Unable to trace contractions, toco continues to be ajdusted. Palapating about every 2-5 minutes. Dr Rodman Pickleassidy notified regarding redose. IUPC requested to help better manage Pitocin.

## 2014-10-05 NOTE — Progress Notes (Signed)
Katelyn Peters is a 28 y.o. G1P0000 at 7974w0d by LMP admitted for induction of labor due to Hypertension.  Subjective: Patient feeling more pressure  Objective: BP 139/89 mmHg  Pulse 116  Temp(Src) 97.6 F (36.4 C) (Oral)  Resp 18  Ht 5' 3.5" (1.613 m)  Wt 121.564 kg (268 lb)  BMI 46.72 kg/m2  SpO2 100%  LMP 12/30/2013      FHT:  FHR: 130 bpm, variability: moderate,  accelerations:  Abscent,  decelerations:  Absent UC:   regular, every 2-3 minutes SVE:   Dilation: 9 Effacement (%): 90 Station: -1 Exam by:: L. Cresenzo, rN  Labs: Lab Results  Component Value Date   WBC 9.7 10/05/2014   HGB 11.7* 10/05/2014   HCT 36.2 10/05/2014   MCV 83.2 10/05/2014   PLT 252 10/05/2014    Assessment / Plan: Induction of labor due to gestational hypertension,  progressing well on pitocin Patient is RIM by exam  She is entering second stage of labor. Attempt made to manually reduce anterior lip but unsuccessful  Labor: Progressing normally Preeclampsia:  no signs or symptoms of toxicity Fetal Wellbeing:  Category I Pain Control:  Epidural I/D:  n/a Anticipated MOD:  NSVD  Katelyn Peters 10/05/2014, 9:15 PM

## 2014-10-05 NOTE — Progress Notes (Signed)
Report recv'd, care assumed

## 2014-10-05 NOTE — Anesthesia Preprocedure Evaluation (Signed)
Anesthesia Evaluation  Patient identified by MRN, date of birth, ID band Patient awake    Reviewed: Allergy & Precautions, H&P , NPO status , Patient's Chart, lab work & pertinent test results, reviewed documented beta blocker date and time   History of Anesthesia Complications Negative for: history of anesthetic complications  Airway Mallampati: I  TM Distance: >3 FB Neck ROM: full    Dental  (+) Teeth Intact   Pulmonary neg pulmonary ROS,  breath sounds clear to auscultation        Cardiovascular hypertension (PIH), Rhythm:regular Rate:Normal     Neuro/Psych negative neurological ROS  negative psych ROS   GI/Hepatic negative GI ROS, Neg liver ROS,   Endo/Other  Morbid obesity  Renal/GU negative Renal ROS     Musculoskeletal   Abdominal   Peds  Hematology negative hematology ROS (+)   Anesthesia Other Findings   Reproductive/Obstetrics (+) Pregnancy                             Anesthesia Physical Anesthesia Plan  ASA: III  Anesthesia Plan: Epidural   Post-op Pain Management:    Induction:   Airway Management Planned:   Additional Equipment:   Intra-op Plan:   Post-operative Plan:   Informed Consent: I have reviewed the patients History and Physical, chart, labs and discussed the procedure including the risks, benefits and alternatives for the proposed anesthesia with the patient or authorized representative who has indicated his/her understanding and acceptance.     Plan Discussed with:   Anesthesia Plan Comments:         Anesthesia Quick Evaluation

## 2014-10-05 NOTE — H&P (Signed)
Katelyn Peters is a 28 y.o. female presenting for induction of labor for gestational hypertension. She had a headache yesterday that has resolved. No RUQ pain, no blurry vision.  She has rare ctx, no Vaginal bleeding, no LOF, notes good fetal movement.   Maternal Medical History:  Reason for admission: Nausea. Induction of labor d/t pregnancy induced hypertension  Contractions: Onset was less than 1 hour ago.   Frequency: irregular.   Duration is approximately 60 seconds.   Perceived severity is mild.    Fetal activity: Perceived fetal activity is normal.   Last perceived fetal movement was within the past 12 hours.    Prenatal complications: PIH.   Prenatal Complications - Diabetes: none.    OB History    Gravida Para Term Preterm AB TAB SAB Ectopic Multiple Living   1 0 0 0 0 0 0 0 0 0      Past Medical History  Diagnosis Date  . Medical history non-contributory   . Anxiety    Past Surgical History  Procedure Laterality Date  . No past surgeries     Family History: family history is not on file. Social History:  reports that she has never smoked. She has never used smokeless tobacco. She reports that she does not drink alcohol or use illicit drugs.   Prenatal Transfer Tool  Maternal Diabetes: No Genetic Screening: Normal Maternal Ultrasounds/Referrals: Normal Fetal Ultrasounds or other Referrals:  Other: Normal anatomy scan Maternal Substance Abuse:  No Significant Maternal Medications:  Meds include: Other: PNV Significant Maternal Lab Results:  Lab values include: Group B Strep negative, Rh negative Other Comments:  None  Review of Systems  Constitutional: Negative for fever and chills.  Eyes: Negative for blurred vision.  Respiratory: Negative for cough.   Cardiovascular: Negative for chest pain.  Gastrointestinal: Negative for heartburn and nausea.  Genitourinary: Negative for dysuria.  Musculoskeletal: Negative for myalgias.  Neurological: Positive for  headaches.  All other systems reviewed and are negative.   Dilation: 1 Effacement (%): Thick Station: -3 Exam by:: Dr. Mora ApplPinn Blood pressure 141/80, pulse 106, temperature 97.7 F (36.5 C), temperature source Oral, resp. rate 20, height 5' 3.5" (1.613 m), weight 121.564 kg (268 lb), last menstrual period 12/30/2013. Maternal Exam:  Uterine Assessment: Contraction strength is mild.  Contraction duration is 60 seconds. Contraction frequency is irregular.   Abdomen: Patient reports no abdominal tenderness. Fundal height is 39 cm.   Estimated fetal weight is 3200 grams.   Fetal presentation: vertex  Introitus: Normal vulva. Normal vagina.  Ferning test: not done.  Nitrazine test: not done. Amniotic fluid character: not assessed.  Pelvis: adequate for delivery.   Cervix: Cervix evaluated by digital exam.   1/30/-3  Fetal Exam Fetal Monitor Review: Mode: hand-held doppler probe.   Baseline rate: 145.  Variability: moderate (6-25 bpm).   Pattern: accelerations present and no decelerations.    Fetal State Assessment: Category I - tracings are normal.     Physical Exam  Constitutional: She is oriented to person, place, and time. She appears well-developed and well-nourished.  HENT:  Head: Normocephalic and atraumatic.  Eyes: Pupils are equal, round, and reactive to light.  Neck: Normal range of motion.  Cardiovascular: Normal rate.   Respiratory: Effort normal.  GI: Soft.  Neurological: She is alert and oriented to person, place, and time. She has normal reflexes.    Prenatal labs: ABO, Rh: --/--/O NEG, O NEG (12/07 2200) Antibody: NEG (12/07 2200) Rubella: Immune (06/05 0000) RPR:  NON REAC (12/07 2100)  HBsAg: Negative (06/05 0000)  HIV: Non-reactive (06/05 0000)  GBS: Negative (11/12 0000)   Assessment/Plan: 28 yo G1P0 at 39 weeks for IOL for gestational hypertension. BP mild range Will closely monitor for s/sx of preeclampsia PIH labs normal Misoprostol for  cervical ripening Epidural on demand  Satoru Milich [name] 10/05/2014, 7:43 AM

## 2014-10-06 ENCOUNTER — Encounter (HOSPITAL_COMMUNITY): Payer: Self-pay | Admitting: *Deleted

## 2014-10-06 DIAGNOSIS — IMO0001 Reserved for inherently not codable concepts without codable children: Secondary | ICD-10-CM

## 2014-10-06 LAB — CBC
HEMATOCRIT: 32.9 % — AB (ref 36.0–46.0)
HEMOGLOBIN: 10.9 g/dL — AB (ref 12.0–15.0)
MCH: 27.3 pg (ref 26.0–34.0)
MCHC: 33.1 g/dL (ref 30.0–36.0)
MCV: 82.5 fL (ref 78.0–100.0)
Platelets: 223 10*3/uL (ref 150–400)
RBC: 3.99 MIL/uL (ref 3.87–5.11)
RDW: 16.6 % — ABNORMAL HIGH (ref 11.5–15.5)
WBC: 16.4 10*3/uL — ABNORMAL HIGH (ref 4.0–10.5)

## 2014-10-06 MED ORDER — ZOLPIDEM TARTRATE 5 MG PO TABS: 5.0000 mg | ORAL_TABLET | Freq: Every evening | ORAL | Status: DC | PRN

## 2014-10-06 MED ORDER — WITCH HAZEL-GLYCERIN EX PADS: 1.0000 "application " | MEDICATED_PAD | CUTANEOUS | Status: DC | PRN

## 2014-10-06 MED ORDER — PRENATAL MULTIVITAMIN CH
1.0000 | ORAL_TABLET | Freq: Every day | ORAL | Status: DC
  Administered 2014-10-06 – 2014-10-07 (×2): 1 via ORAL
  Filled 2014-10-06 (×2): qty 1

## 2014-10-06 MED ORDER — INFLUENZA VAC SPLIT QUAD 0.5 ML IM SUSY: 0.5000 mL | PREFILLED_SYRINGE | INTRAMUSCULAR | Status: DC

## 2014-10-06 MED ORDER — BENZOCAINE-MENTHOL 20-0.5 % EX AERO
1.0000 "application " | INHALATION_SPRAY | CUTANEOUS | Status: DC | PRN
  Administered 2014-10-06: 1 via TOPICAL
  Filled 2014-10-06: qty 56

## 2014-10-06 MED ORDER — SENNOSIDES-DOCUSATE SODIUM 8.6-50 MG PO TABS
2.0000 | ORAL_TABLET | ORAL | Status: DC
  Filled 2014-10-06: qty 2

## 2014-10-06 MED ORDER — DIBUCAINE 1 % RE OINT: 1.0000 "application " | TOPICAL_OINTMENT | RECTAL | Status: DC | PRN

## 2014-10-06 MED ORDER — OXYCODONE-ACETAMINOPHEN 5-325 MG PO TABS: 2.0000 | ORAL_TABLET | ORAL | Status: DC | PRN

## 2014-10-06 MED ORDER — SIMETHICONE 80 MG PO CHEW: 80.0000 mg | CHEWABLE_TABLET | ORAL | Status: DC | PRN

## 2014-10-06 MED ORDER — TETANUS-DIPHTH-ACELL PERTUSSIS 5-2.5-18.5 LF-MCG/0.5 IM SUSP
0.5000 mL | Freq: Once | INTRAMUSCULAR | Status: AC
  Administered 2014-10-07: 0.5 mL via INTRAMUSCULAR

## 2014-10-06 MED ORDER — IBUPROFEN 600 MG PO TABS
600.0000 mg | ORAL_TABLET | Freq: Four times a day (QID) | ORAL | Status: DC
  Administered 2014-10-06 – 2014-10-07 (×7): 600 mg via ORAL
  Filled 2014-10-06 (×7): qty 1

## 2014-10-06 MED ORDER — DIPHENHYDRAMINE HCL 25 MG PO CAPS: 25.0000 mg | ORAL_CAPSULE | Freq: Four times a day (QID) | ORAL | Status: DC | PRN

## 2014-10-06 MED ORDER — ONDANSETRON HCL 4 MG PO TABS: 4.0000 mg | ORAL_TABLET | ORAL | Status: DC | PRN

## 2014-10-06 MED ORDER — OXYCODONE-ACETAMINOPHEN 5-325 MG PO TABS
1.0000 | ORAL_TABLET | ORAL | Status: DC | PRN
  Administered 2014-10-06: 1 via ORAL
  Filled 2014-10-06: qty 1

## 2014-10-06 MED ORDER — OXYTOCIN 40 UNITS IN LACTATED RINGERS INFUSION - SIMPLE MED: 62.5000 mL/h | INTRAVENOUS | Status: DC | PRN

## 2014-10-06 MED ORDER — ONDANSETRON HCL 4 MG/2ML IJ SOLN: 4.0000 mg | INTRAMUSCULAR | Status: DC | PRN

## 2014-10-06 MED ORDER — LANOLIN HYDROUS EX OINT: TOPICAL_OINTMENT | CUTANEOUS | Status: DC | PRN

## 2014-10-06 NOTE — Plan of Care (Signed)
Problem: Phase II Progression Outcomes Goal: Other Phase II Outcomes/Goals Outcome: Not Applicable Date Met:  10/06/14     

## 2014-10-06 NOTE — Plan of Care (Signed)
Problem: Phase I Progression Outcomes Goal: Other Phase I Outcomes/Goals Outcome: Not Applicable Date Met:  58/00/63

## 2014-10-06 NOTE — Plan of Care (Signed)
Problem: Phase II Progression Outcomes Goal: Afebrile, VS remain stable Outcome: Completed/Met Date Met:  10/06/14 Goal: Rh isoimmunization per orders Outcome: Completed/Met Date Met:  10/06/14

## 2014-10-06 NOTE — Progress Notes (Signed)
Patient is doing well.  She is ambulating, voiding, tolerating PO.  Pain control is good, but c/o significant vulvar and hip soreness (pushed x 3 hr).  Lochia is appropriate  Filed Vitals:   10/06/14 0200 10/06/14 0231 10/06/14 0300 10/06/14 0404  BP: 139/86 137/89 134/82 136/86  Pulse: 113 92 102 103  Temp:   99.8 F (37.7 C) 99.6 F (37.6 C)  TempSrc:   Oral Oral  Resp: 18 18 20 18   Height:      Weight:      SpO2:        NAD Fundus firm Ext: symmetric, neg homa  Lab Results  Component Value Date   WBC 16.4* 10/06/2014   HGB 10.9* 10/06/2014   HCT 32.9* 10/06/2014   MCV 82.5 10/06/2014   PLT 223 10/06/2014    --/--/O NEG, O NEG (12/07 2200)/RImmune  A/P 28 y.o. G1P1001 PPD#0 s/p TSVD. Routine care.   Rh neg: studies pending  Desires circumcision.  Discussed r/b/a of the procedure. Reviewed that circumcision is an elective surgical procedure and not considered medically necessary. Reviewed the risks of the procedure including the risk of infection, bleeding, damage to surrounding structures, including scrotum, shaft, urethra and head of penis, and an undesired cosmetic effect requiring additional procedures for revision.   Baby has not yet voided and no H&P on chart.  Will defer circ until this PM or tomrorow     [place], [facility]ling[facility] Medical CenterDYANNA

## 2014-10-06 NOTE — Plan of Care (Signed)
Problem: Consults Goal: Postpartum Patient Education (See Patient Education module for education specifics.) Outcome: Completed/Met Date Met:  10/06/14  Problem: Phase I Progression Outcomes Goal: Pain controlled with appropriate interventions Outcome: Completed/Met Date Met:  10/06/14 Goal: OOB as tolerated unless otherwise ordered Outcome: Progressing Goal: VS, stable, temp < 100.4 degrees F Outcome: Completed/Met Date Met:  10/06/14 Goal: Initial discharge plan identified Outcome: Completed/Met Date Met:  10/06/14

## 2014-10-06 NOTE — Plan of Care (Signed)
Problem: Phase I Progression Outcomes Goal: Voiding adequately Outcome: Completed/Met Date Met:  10/06/14 Goal: OOB as tolerated unless otherwise ordered Outcome: Completed/Met Date Met:  10/06/14  Problem: Phase II Progression Outcomes Goal: Pain controlled on oral analgesia Outcome: Completed/Met Date Met:  10/06/14 Goal: Progress activity as tolerated unless otherwise ordered Outcome: Completed/Met Date Met:  10/06/14 Goal: Tolerating diet Outcome: Completed/Met Date Met:  10/06/14  Problem: Discharge Progression Outcomes Goal: Barriers To Progression Addressed/Resolved Outcome: Not Applicable Date Met:  17/51/02 Goal: Tolerating diet Outcome: Completed/Met Date Met:  58/52/77 Goal: Complications resolved/controlled Outcome: Not Applicable Date Met:  82/42/35 Goal: Pain controlled with appropriate interventions Outcome: Completed/Met Date Met:  10/06/14

## 2014-10-06 NOTE — Lactation Note (Signed)
This note was copied from the chart of Katelyn Peters. Lactation Consultation Note  Patient Name: Katelyn Leandrew KoyanagiKathleen Lesage ZOXWR'UToday's Date: 10/06/2014 Reason for consult: Initial assessment;Difficult latch  Visited with Mom and baby at 916 hrs old.  Baby showing feeding cues, and Mom having a difficult time latching.  This is Mom's first baby.  Baby in football hold,  Added more pillows for proper support.  Basics reviewed on hand placement and importance of a deep areolar latch.  Mom with large breasts, with mainly flat nipples.  Areola very compressible.  Demonstrated manual breast expression, and colostrum easily expressed into baby's mouth.  Tried without use of a nipple shield several times, after use of a manual pump to pull nipple out.  Nipple tip pulled on top, and pinching felt.  Initiated a 24 mm nipple shield with instructions on use and care.  Nipple pulled well into shield and baby was able to latch, but tends to stay too shallow on shield.  Helped to sandwich the breast and gently pull on baby's lower lip.  Multiple swallows heard when baby able to maintain a deeper, wider grasp.  Baby fed for a total of 10 minutes in multiple stages, latch score 6.  Baby left on Mom's chest skin to skin sleeping.   Encouraged Mom to pre pump using manual pump and use manual breast expression.  Curved tip syring left in room to pre load the nipple shield at next feeding.  Bedside RN made aware. Brochure left in room.  Explained about IP and OP lactation services available to her, along with other community resources available.  Encouraged skin to skin, and feeding baby on cue.  To follow up in am or prn. Consult Status Consult Status: Follow-up Date: 10/07/14 Follow-up type: In-patient    Judee ClaraSmith, [name] E 10/06/2014, 4:50 PM

## 2014-10-06 NOTE — Anesthesia Postprocedure Evaluation (Signed)
  Anesthesia Post-op Note  Patient: Katelyn Peters  Procedure(s) Performed: * No procedures listed *  Patient Location: Mother/Baby  Anesthesia Type:Epidural  Level of Consciousness: awake, alert , oriented and patient cooperative  Airway and Oxygen Therapy: Patient Spontanous Breathing  Post-op Pain: mild  Post-op Assessment: Post-op Vital signs reviewed, Patient's Cardiovascular Status Stable, Respiratory Function Stable, Patent Airway, No headache, No backache, No residual numbness and No residual motor weakness  Post-op Vital Signs: Reviewed and stable  Last Vitals:  Filed Vitals:   10/06/14 0404  BP: 136/86  Pulse: 103  Temp: 37.6 C  Resp: 18    Complications: No apparent anesthesia complications

## 2014-10-07 LAB — CBC
HCT: 31.3 % — ABNORMAL LOW (ref 36.0–46.0)
Hemoglobin: 10 g/dL — ABNORMAL LOW (ref 12.0–15.0)
MCH: 26.9 pg (ref 26.0–34.0)
MCHC: 31.9 g/dL (ref 30.0–36.0)
MCV: 84.1 fL (ref 78.0–100.0)
Platelets: 205 10*3/uL (ref 150–400)
RBC: 3.72 MIL/uL — ABNORMAL LOW (ref 3.87–5.11)
RDW: 17 % — AB (ref 11.5–15.5)
WBC: 11.8 10*3/uL — ABNORMAL HIGH (ref 4.0–10.5)

## 2014-10-07 MED ORDER — RHO D IMMUNE GLOBULIN 1500 UNIT/2ML IJ SOSY
300.0000 ug | PREFILLED_SYRINGE | Freq: Once | INTRAMUSCULAR | Status: AC
  Administered 2014-10-07: 300 ug via INTRAMUSCULAR
  Filled 2014-10-07: qty 2

## 2014-10-07 NOTE — Progress Notes (Signed)
Pt registering in this lab as O NEG- baby is RH +.  Will give Rhogam.  Explained to pt different test her says she is NEGATIVE.

## 2014-10-07 NOTE — Progress Notes (Signed)
Patient is eating, ambulating, voiding.  Pain control is good.  Filed Vitals:   10/06/14 0404 10/06/14 0830 10/06/14 1740 10/07/14 0636  BP: 136/86 121/77 129/73 119/76  Pulse: 103 84 90 79  Temp: 99.6 F (37.6 C) 98.6 F (37 C) 98.5 F (36.9 C) 98.9 F (37.2 C)  TempSrc: Oral Oral Oral Oral  Resp: 18 20  19   Height:      Weight:      SpO2:   98% 98%    Fundus firm Perineum without swelling.  Lab Results  Component Value Date   WBC 11.8* 10/07/2014   HGB 10.0* 10/07/2014   HCT 31.3* 10/07/2014   MCV 84.1 10/07/2014   PLT 205 10/07/2014    --/--/O NEG, O NEG (12/07 2200)/RI  A/P Post partum day 2.  Routine care.  Expect d/c today after circ done.  Baby RH+--Rhogam.  Minta Fair A

## 2014-10-07 NOTE — Lactation Note (Signed)
This note was copied from the chart of Katelyn Leandrew KoyanagiKathleen Reicher. Lactation Consultation Note Baby fussy, mom has #24 NS and baby doesn't like suckling on NS. Appears slightly loose. Fitted W/#20 NS. Mom has lots of colostrum. Hand expressed colostrum in vial and gave syring. Baby appears to have a tight tongue w/cupping and limited movement during suckling. Demonstrated "C" hold when latching w/NS and application. Baby wants to pop off and on, doing better after about 15 min. Heard swallows. Encouraged cheek to breast to obtain deep latch. Mom states comfort during feeding.  Patient Name: Katelyn Leandrew KoyanagiKathleen Hisle ZOXWR'UToday's Date: 10/07/2014     Maternal Data    Feeding Feeding Type: Breast Fed Length of feed: 0 min  LATCH Score/Interventions Latch: Repeated attempts needed to sustain latch, nipple held in mouth throughout feeding, stimulation needed to elicit sucking reflex. Intervention(s): Teach feeding cues Intervention(s): Assist with latch;Adjust position  Audible Swallowing: None  Type of Nipple: Flat Intervention(s): Shells;Hand pump  Comfort (Breast/Nipple): Soft / non-tender     Hold (Positioning): Assistance needed to correctly position infant at breast and maintain latch.  LATCH Score: 5  Lactation Tools Discussed/Used Tools: Nipple Shields   Consult Status      Nyaire Denbleyker G 10/07/2014, 1:17 AM

## 2014-10-07 NOTE — Lactation Note (Signed)
This note was copied from the chart of Katelyn Leandrew KoyanagiKathleen Jayson. Lactation Consultation Note  Visited mother per Herbert SetaHeather RN request and mother's asked for assistance. Mother concerned about her milk supply.  Mother needed reassurance. Reviewed hand expression and mother has good flow of colostrum. Reviewed how to put on NS and the breastfeeding then pumping plan. Mother's nipples are pink and tender.  Small blister on tip of left nipple. Provided mother with comfort gels. Encouraged mother to hand express before and after pumping to boost her supply.    Patient Name: Katelyn Peters WJXBJ'YToday's Date: 10/07/2014 Reason for consult: Follow-up assessment   Maternal Data    Feeding Feeding Type: Breast Milk  LATCH Score/Interventions                      Lactation Tools Discussed/Used     Consult Status Consult Status: Follow-up Date: 10/08/14 Follow-up type: In-patient    Dahlia ByesBerkelhammer, Anea Fodera [facility] 10/07/2014, 6:18 PM

## 2014-10-07 NOTE — Plan of Care (Signed)
Problem: Discharge Progression Outcomes Goal: Activity appropriate for discharge plan Outcome: Completed/Met Date Met:  10/07/14 Goal: Afebrile, VS remain stable at discharge Outcome: Completed/Met Date Met:  10/07/14 Goal: Discharge plan in place and appropriate Outcome: Completed/Met Date Met:  10/07/14 Goal: Other Discharge Outcomes/Goals Outcome: Completed/Met Date Met:  10/07/14

## 2014-10-07 NOTE — Lactation Note (Signed)
This note was copied from the chart of Katelyn Leandrew KoyanagiKathleen Hardcastle. Lactation Consultation Note  Katelyn Peters is 34 hours and is not latching consistently to feed at the breast.  He continues to be sleepy.  Mom has been using a manual pump to express milk for syringe feeding.  Oral assessment: Katelyn Peters does not open his mouth wide and when he cries his tongue is elevated to the center of his mouth.  A frenum is noted about 3 mm from the tip of his tongue.  It is difficult to get a gloved finger past his tongue.  With coaxing I was able to get my finger deeply into his mouth.  He is able to maintain a vacuum.  His upper lip had to be flanged.  Mom agreed to a laid back position.  He was placed on her bare abdomen and was fussy while he was there.  Drops of colostrum were placed on his lips and he settled.  He latched to the right breast using a # 24 NS.  Mom reported feeling tugs after a few sucks.  He stayed there for about 3 minutes and then fell asleep.  Parents agreeable to a plan that includes breast attempt, finger feeding with an SNS and use of a double electric breast pump.  He was fed 11 ml of expressed colostrum via SNS finger feeder.  Dad was able to feed him with this method.  Follow-up tomorrow or sooner if needed. Patient Name: Katelyn Peters WUJWJ'XToday's Date: 10/07/2014 Reason for consult: Follow-up assessment   Maternal Data Has patient been taught Hand Expression?: Yes  Feeding Feeding Type: Breast Fed Length of feed: 3 min  LATCH Score/Interventions Latch: Repeated attempts needed to sustain latch, nipple held in mouth throughout feeding, stimulation needed to elicit sucking reflex. Intervention(s): Skin to skin Intervention(s): Adjust position;Assist with latch;Breast compression  Audible Swallowing: A few with stimulation Intervention(s): Skin to skin;Hand expression  Type of Nipple: Flat  Comfort (Breast/Nipple): Soft / non-tender     Hold (Positioning): Assistance needed to correctly  position infant at breast and maintain latch. Intervention(s): Support Pillows;Breastfeeding basics reviewed;Position options;Skin to skin  LATCH Score: 6  Lactation Tools Discussed/Used Tools: Nipple Shields Nipple shield size: 24 Breast pump type: Double-Electric Breast Pump (to be set up) Pump Review: Setup, frequency, and cleaning;Milk Storage Initiated by:: Katelyn DryerMaryAnn Hart Haas, RN,IBCLC Date initiated:: 10/07/14   Consult Status Consult Status: Follow-up Date: 10/08/14 Follow-up type: In-patient    Katelyn Peters, Katelyn Peters 10/07/2014, 11:45 AM

## 2014-10-08 ENCOUNTER — Ambulatory Visit: Payer: Self-pay

## 2014-10-08 LAB — RH IG WORKUP (INCLUDES ABO/RH)
ABO/RH(D): O NEG
Fetal Screen: NEGATIVE
Gestational Age(Wks): 39.1
Unit division: 0

## 2014-10-08 NOTE — Lactation Note (Addendum)
This note was copied from the chart of Katelyn Peters. Lactation Consultation Note  Patient Name: Katelyn Peters ONGEX'BToday's Date: 10/08/2014 Reason for consult: Follow-up assessment  Mom's milk is not yet in, but she was able to pump a considerable amount, (33ml), which will be used for supplementation.  Parents aware & amenable to changing feeding plan so that baby is ensured increased intake.  Mom has my # to call for assist w/next feeding.  Note: Small blood blister noted on L nipple.  Mom attributes it to poor latching earlier on. Katelyn Peters, Katelyn Peters Katelyn Peters 10/08/2014, 9:43 AM

## 2014-10-09 ENCOUNTER — Ambulatory Visit: Payer: Self-pay

## 2014-10-09 NOTE — Lactation Note (Signed)
This note was copied from the chart of Katelyn Leandrew KoyanagiKathleen Klippel. Lactation Consultation Note: Follow up visit with mom. She is tearful when I went into room. Reports she has been pumping and bottle feeding EBM through the night and that is going well. . Reports she is continuing to have problems with him latching on. Suggested still giving baby lots of skin to skin and offering breast so he stays familiar with it, Discussed making an OP appointment when she is ready for assist with latch. Has Medela PIS at home. No questions at present. To call prn   Patient Name: Katelyn Peters ZOXWR'UToday's Date: 10/09/2014 Reason for consult: Follow-up assessment   Maternal Data Formula Feeding for Exclusion: No  Feeding Feeding Type: Bottle Fed - Breast Milk  LATCH Score/Interventions                      Lactation Tools Discussed/Used     Consult Status Consult Status: PRN    Pamelia HoitWeeks, Rylan Bernard D 10/09/2014, 11:36 AM

## 2014-10-12 NOTE — Discharge Summary (Signed)
Obstetric Discharge Summary Reason for Admission: induction of labor for PIH at term Prenatal Procedures: none Intrapartum Procedures: spontaneous vaginal delivery Postpartum Procedures: Rho(D) Ig Complications-Operative and Postpartum: 1st degree perineal laceration HEMOGLOBIN  Date Value Ref Range Status  10/07/2014 10.0* 12.0 - 15.0 g/dL Final   HCT  Date Value Ref Range Status  10/07/2014 31.3* 36.0 - 46.0 % Final     Discharge Diagnoses: Term Pregnancy-delivered  Discharge Information: Date: 10/12/2014 Activity: pelvic rest Diet: routine Medications: Ibuprofen Condition: stable Instructions: refer to practice specific booklet Discharge to: home   Newborn Data: Live born female  Birth Weight: 6 lb 10.9 oz (3030 g) APGAR: 3, 9  Home with mother.  Katelyn Peters A 10/12/2014, 7:25 AM

## 2014-11-05 ENCOUNTER — Ambulatory Visit (HOSPITAL_COMMUNITY)
Admission: RE | Admit: 2014-11-05 | Discharge: 2014-11-05 | Disposition: A | Discharge status: Home / Self Care | Payer: 59 | Source: Ambulatory Visit | Attending: Obstetrics and Gynecology | Admitting: Obstetrics and Gynecology

## 2014-11-05 NOTE — Lactation Note (Signed)
Lactation Consult  Mother's reason for visit:  Visit Type:  Feeding assessment Appointment Notes:  none Consult:  Initial Lactation Consultant:  Huston FoleyMOULDEN, Sobia Karger S  ________________________________________________________________________  Baby's Name: Katelyn Peters Date of Birth: 10/06/2013 Pediatrician: Gender: female Gestational Age: 612w1d (At Birth) Birth Weight: 6 lb 10.9 oz (3030 g) Weight at Discharge: Weight: 6 lb 3.1 oz (2810 g)Date of Discharge: 10/09/2014 Filed Weights   10/07/14 0053 10/08/14 0030 10/09/14 0000  Weight: 6 lb 6.7 oz (2910 g) 6 lb 3.1 oz (2810 g) 6 lb 3.1 oz (2810 g)   [facility] 7-7  Location:Pediatrician's office Weight today:      Admission Information     Provider Service Admission Date    Dory PeruKirsten R Brown, MD Newborn 10/06/2013          ADT Events       Unit Room Bed Service Event    10/06/14 0037 [facility] [id number] Newborn Admission    10/06/14 0105 [facility] 9197 [id number] Newborn Transfer Out    10/06/14 0105 [facility] 9150 [id number] Newborn Transfer In    10/07/14 1746 [facility] 9150 [id number] Newborn Patient Update    10/09/14 1716 [facility] 9150 [id number] Newborn Discharge      Weight Information (last 4 days) before discharge     Date/Time   Weight   BSA (Calculated - sq m)   BMI (Calculated) Who      10/09/14 0000  6 lb 3.1 oz (2810 g)  --  -- CM     10/08/14 0030  6 lb 3.1 oz (2810 g)  --  -- SW     10/07/14 0053  6 lb 6.7 oz (2910 g)  --  -- JB     10/06/14 0037  6 lb 10.9 oz (3030 g)  --  -- JJ           Weights    Go to now  Calendar     10/03/14 - Today            One Day Eight Hours  View All    12/09 12/10 12/11  12/12   Time:  0037 0053 0030 0000    Weight   Weight  6 lb 1... 6 lb 6... 6 lb 3... 6 lb 3...  Weight         ##[id number](875)^[id number]^09^[id number]^Eprivate(648)^[id number]^60^20^[id number]^         ________________________________________________________________________  Mother's Name: Katelyn Peters Type of delivery:  vaginal Breastfeeding Experience: first baby Maternal Medical Conditions:  Pregnancy induced hypertension Maternal Medications: PNV'S, IBUPROFEN  ________________________________________________________________________  Breastfeeding History (Post Discharge)  Frequency of breastfeeding:  EVERY 3-4 HOURS Duration of feeding:  15-40 MINUTES    Infant Intake and Output Assessment  Voids:9-10  24 hrs.  Color:  Clear yellow Stools:  4-6 in 24 hrs.  Color:  Green and Yellow  ________________________________________________________________________  Maternal Breast Assessment  Breast:  Full Nipple:  Erect Pain level:  0 Pain interventions:  Bra  _______________________________________________________________________ Feeding Assessment/Evaluation  Mom and 564 week old baby here for feeding assessment.  Mom was pumping and bottle feeding the first 2 weeks but baby has been latching with nipple shield for 2 weeks.  Mom would like to wean from shield.  Mom has an abundant milk supply.  Assisted with attempting to latch baby without shield but baby very fussy and could not latch.  20 mm nipple shield applies and baby latched easily and nursed actively.  Shield full of  milk when baby came off.  Techniques to wean from shield discussed.  Mom will call us prn.  Initial feeding assessment:  Infant's oral assessment:  WNL  Positioning:  Cradle Right breast  LATCH documentation:  Latch:  2 = Grasps breast easily, tongue down, lips flanged, rhythmical sucking.with 20 mm nipple shield  Audible swallowing:  2 = Spontaneous and intermittent  Type of nipple:  2 = Everted at rest and after stimulation  Comfort (Breast/Nipple):  2 = Soft / non-tender  Hold  (Positioning):  2 = No assistance needed to correctly position infant at breast  LATCH score:  10  Attached assessment:  Deep  Lips flanged:  Yes.    Lips untucked:  No.  Suck assessment:  Nutritive  Tools:  Nipple shield 20 mm Instructed on use and cleaning of tool:  Yes.    Pre-feed [id number]:   g Post-feed weight: 3574  gAmount transferred:  64 ml Amount supplemented:  0 ml  No    Total amount transferred:64  ml Total supplement given:  0 ml

## 2014-11-08 ENCOUNTER — Other Ambulatory Visit: Payer: Self-pay | Admitting: Obstetrics & Gynecology

## 2015-09-10 IMAGING — US US OB DETAIL+14 WK
2 series · 12 of 28 positions shown · non-contrast
Comparison: none

[Series 1: us ob detail +14 wk · 6 of 36 slices shown (1 of 2)]
[im 3/36]
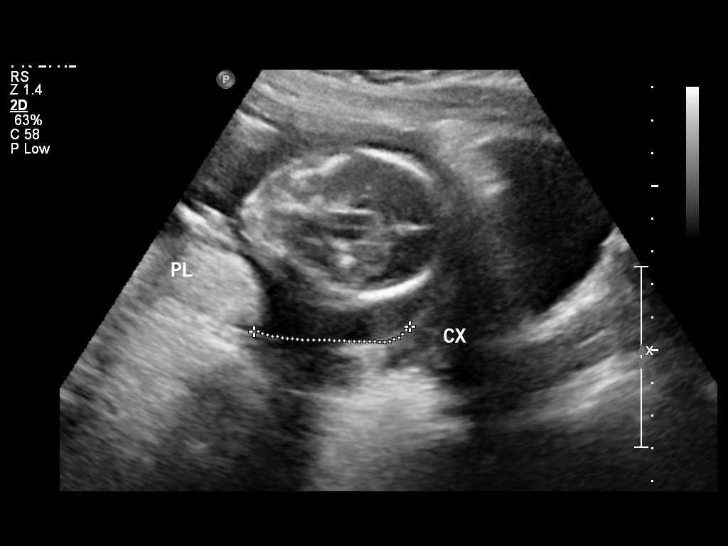
[im 9/36]
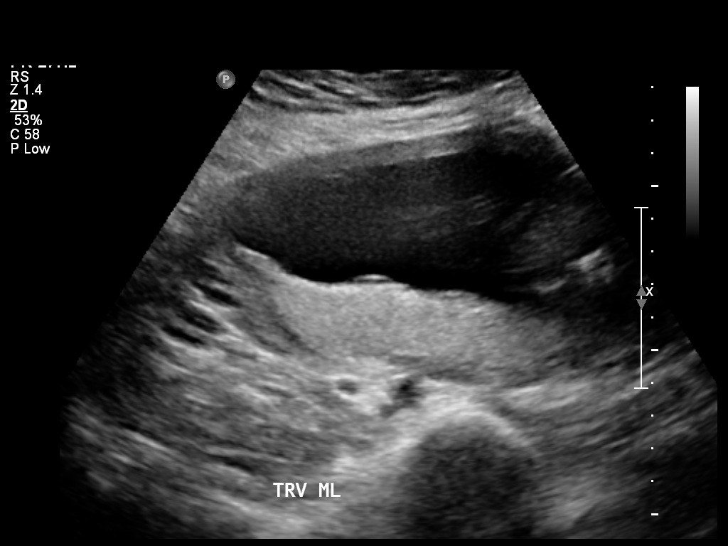
[im 15/36]
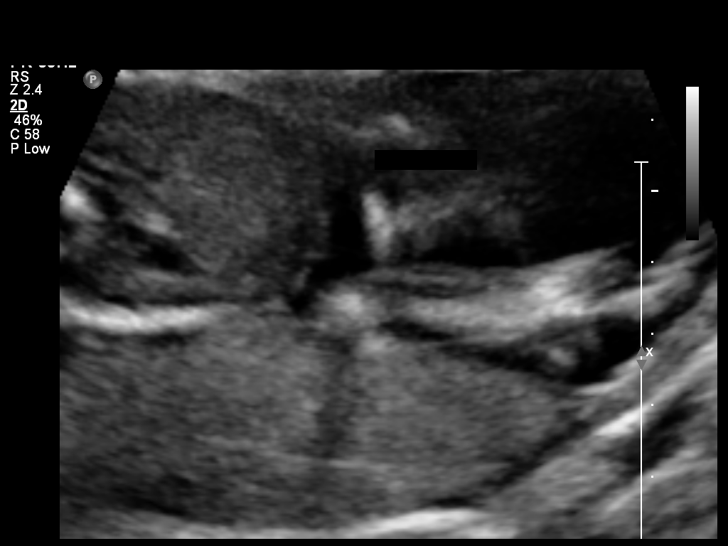
[im 24/36]
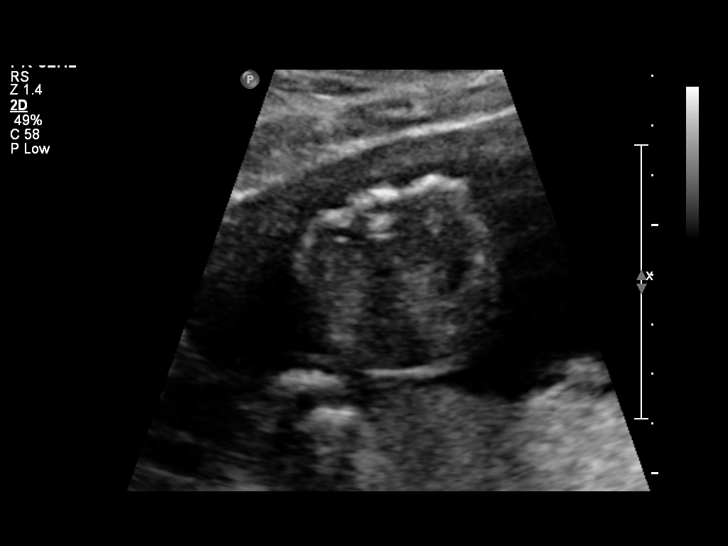
[im 30/36]
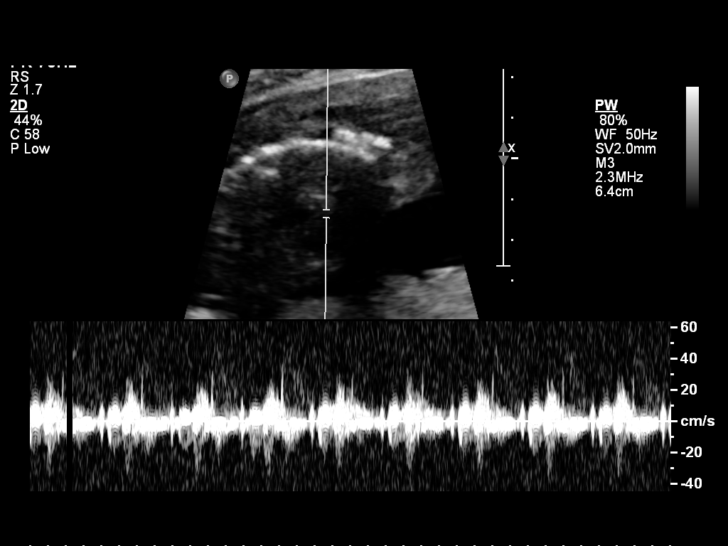
[im 36/36]
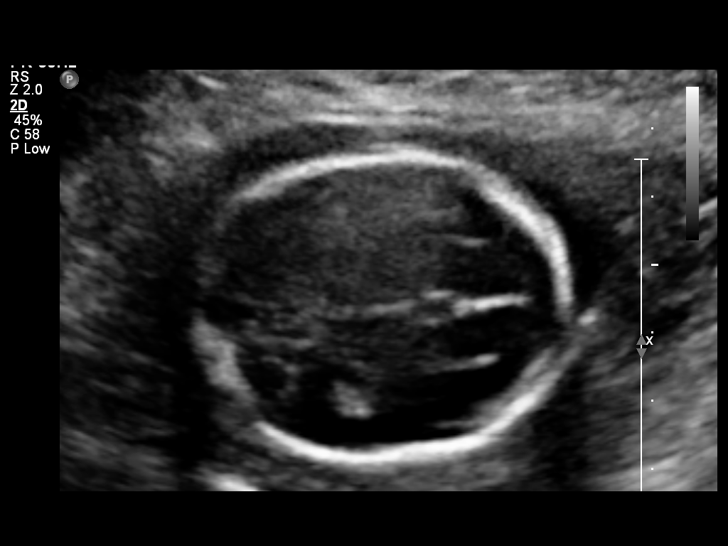

[Series 1: us ob detail +14 wk · 42 acquisitions, 6 frames shown (2 of 2)]
[im 6/42]
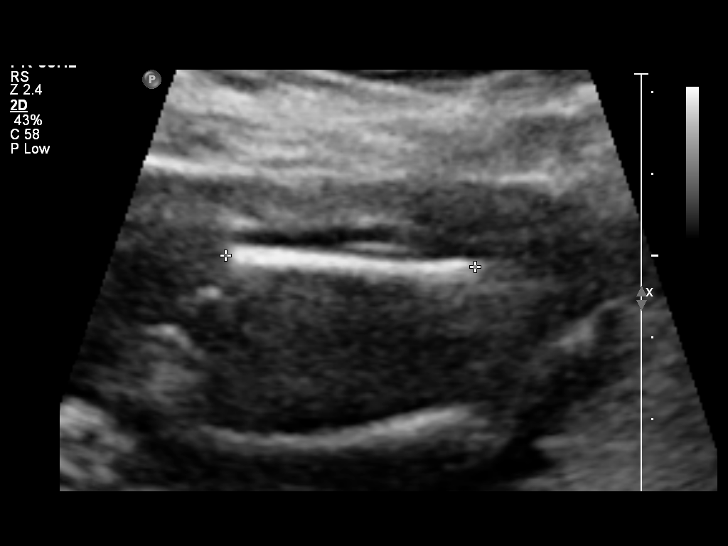
[im 12/42]
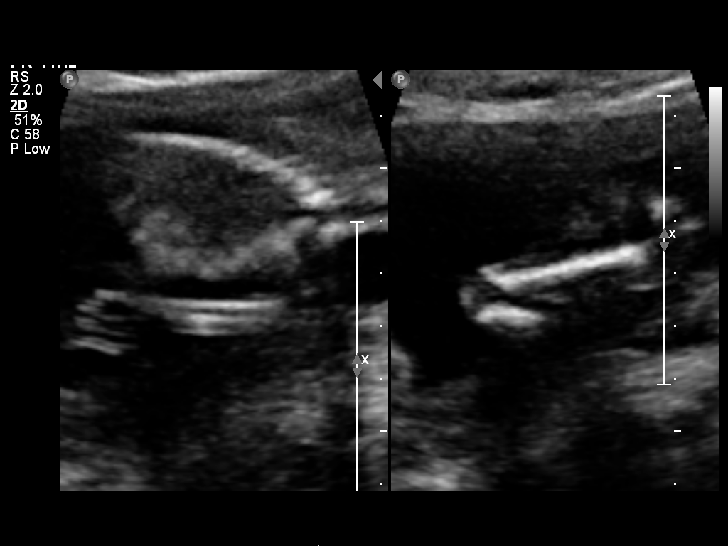
[im 18/42]
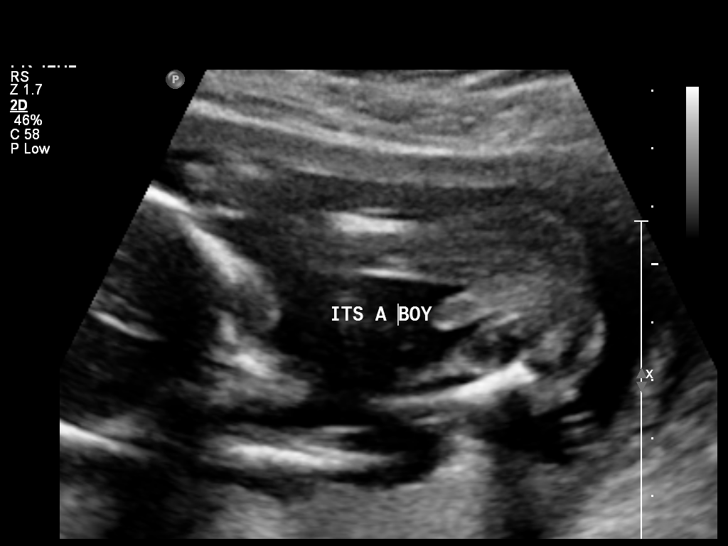
[im 27/42]
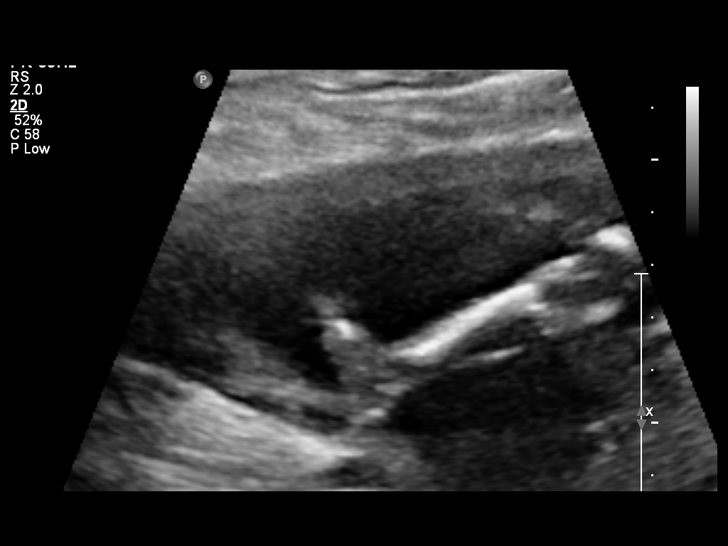
[im 33/42]
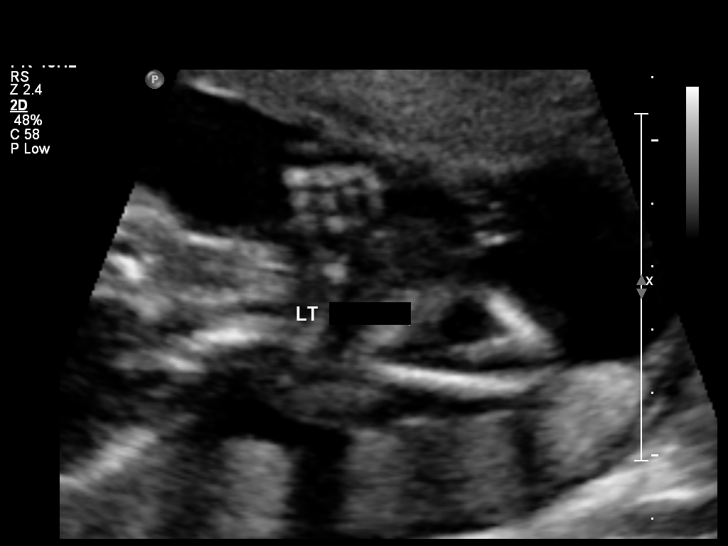
[im 39/42]
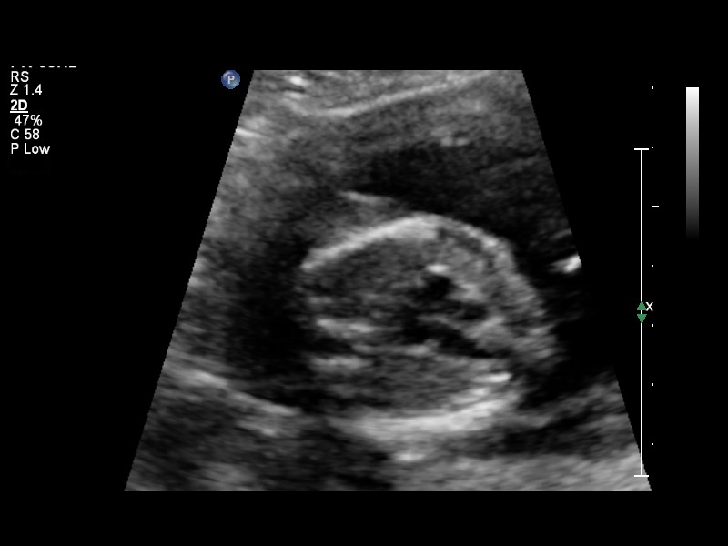

[12 of 28 positions shown; findings below may reference images not displayed]

OBSTETRICS REPORT
                      (Signed Final 05/21/2014 [DATE])

Service(s) Provided

 US OB DETAIL + 14 WK                                  76811.0
Indications

 Maternal morbid obesity
 Detailed fetal anatomic survey
Fetal Evaluation

 Num Of Fetuses:    1
 Cardiac Activity:  Observed
 Presentation:      Variable
 Placenta:          Posterior, above cervical
                    os
 P. Cord            Visualized, central
 Insertion:
Biometry

 BPD:     44.2  mm     G. Age:  19w 3d                CI:         71.1   70 - 86
                                                      FL/HC:      18.2   16.1 -

 HC:       167  mm     G. Age:  19w 3d       40  %    HC/AC:      1.21   1.09 -

 AC:     138.3  mm     G. Age:  19w 2d       39  %    FL/BPD:
 FL:      30.4  mm     G. Age:  19w 3d       42  %    FL/AC:      22.0   20 - 24
 HUM:     28.5  mm     G. Age:  19w 1d       46  %

 Est. FW:     286  gm    0 lb 10 oz      44  %
Gestational Age

 U/S Today:     19w 3d                                        EDD:   10/12/14
 Best:          19w 3d     Det. By:  Previous Ultrasound      EDD:   10/12/14
Anatomy

 Cranium:          Appears normal         Aortic Arch:      Appears normal
 Fetal Cavum:      Appears normal         Ductal Arch:      Appears normal
 Ventricles:       Appears normal         Diaphragm:        Appears normal
 Choroid Plexus:   Appears normal         Stomach:          Appears normal, left
                                                            sided
 Cerebellum:       Appears normal         Abdomen:          Appears normal
 Posterior Fossa:  Appears normal         Abdominal Wall:   Appears nml (cord
                                                            insert, abd wall)
 Nuchal Fold:      Appears normal         Cord Vessels:     Appears normal (3
                                                            vessel cord)
 Face:             Appears normal         Kidneys:          Appear normal
                   (orbits and profile)
 Lips:             Appears normal         Bladder:          Appears normal
 Heart:            Not well visualized    Spine:            Appears normal
 RVOT:             Not well visualized    Lower             Appears normal
                                          Extremities:
 LVOT:             Appears normal         Upper             Appears normal
                                          Extremities:

 Other:  Male gender. Heels and 5th digit visualized. Nasal bone visualized.
         Technically difficult due to maternal habitus and fetal position.
Cervix Uterus Adnexa

 Cervical Length:    3.19     cm

 Cervix:       Normal appearance by transabdominal scan.

 Left Ovary:    Within normal limits.
 Right Ovary:   Within normal limits.
Impression

 SIUP at 98w8d
 EFW 44th%
 No dysmorphic features, noting limitations as detailed above
 No previa
Recommendations

 Recommend follow up attempt to complete fetal survey in 6
 weeks.

 questions or concerns.

## 2019-10-13 ENCOUNTER — Encounter (HOSPITAL_COMMUNITY): Payer: Self-pay

## 2019-10-13 ENCOUNTER — Emergency Department (HOSPITAL_COMMUNITY)
Admission: EM | Admit: 2019-10-13 | Discharge: 2019-10-13 | Disposition: A | Discharge status: Home / Self Care | Payer: Self-pay | Attending: Emergency Medicine | Admitting: Emergency Medicine

## 2019-10-13 ENCOUNTER — Emergency Department (HOSPITAL_COMMUNITY): Payer: Self-pay

## 2019-10-13 ENCOUNTER — Other Ambulatory Visit: Payer: Self-pay

## 2019-10-13 DIAGNOSIS — S92352A Displaced fracture of fifth metatarsal bone, left foot, initial encounter for closed fracture: Secondary | ICD-10-CM | POA: Insufficient documentation

## 2019-10-13 DIAGNOSIS — Y929 Unspecified place or not applicable: Secondary | ICD-10-CM | POA: Insufficient documentation

## 2019-10-13 DIAGNOSIS — Y939 Activity, unspecified: Secondary | ICD-10-CM | POA: Insufficient documentation

## 2019-10-13 DIAGNOSIS — Z975 Presence of (intrauterine) contraceptive device: Secondary | ICD-10-CM | POA: Insufficient documentation

## 2019-10-13 DIAGNOSIS — W010XXA Fall on same level from slipping, tripping and stumbling without subsequent striking against object, initial encounter: Secondary | ICD-10-CM | POA: Insufficient documentation

## 2019-10-13 DIAGNOSIS — Y999 Unspecified external cause status: Secondary | ICD-10-CM | POA: Insufficient documentation

## 2019-10-13 DIAGNOSIS — Z79899 Other long term (current) drug therapy: Secondary | ICD-10-CM | POA: Insufficient documentation

## 2019-10-13 MED ORDER — ACETAMINOPHEN 500 MG PO TABS
1000.0000 mg | ORAL_TABLET | Freq: Once | ORAL | Status: AC
  Administered 2019-10-13: 13:00:00 1000 mg via ORAL
  Filled 2019-10-13: qty 2

## 2019-10-13 NOTE — ED Triage Notes (Signed)
Pt tripped while walked and twisted left foot. Complaining of pain to top of foot.

## 2019-10-13 NOTE — ED Notes (Signed)
Patient states she has severe anxiety. Pt took anxiety med from home supply prior to D/C. States her HR is normally high when she is anxious. Notified Claudia, PA of pt vitals who cleared pt for D/C

## 2019-10-13 NOTE — ED Provider Notes (Signed)
[facility] EMERGENCY DEPARTMENT Provider Note   CSN: [id number] Arrival date & time: 10/13/19  1127     History Chief Complaint  Patient presents with  . Fall    Katelyn Peters is a 33 y.o. female presents to the ER for evaluation of sudden onset, moderate, constant pain in her left foot.  She had a mechanical trip and fall around 10 AM today.  She inverted her foot.  She has tried to put weight on this extremity and is able to but has moderate amount of pain with it.  Has associated swelling.  Denies distal tingling or loss of sensation or any other injuries after the fall.  No interventions.  No alleviating factors.  No previous injuries or surgeries to the area.  HPI     Past Medical History:  Diagnosis Date  . Anxiety   . Medical history non-contributory     Patient Active Problem List   Diagnosis Date Noted  . Status post normal vaginal delivery 10/06/2014  . Hypertension affecting pregnancy 10/04/2014    Past Surgical History:  Procedure Laterality Date  . NO PAST SURGERIES       OB History    Gravida  1   Para  1   Term  1   Preterm  0   AB  0   Living  1     SAB  0   TAB  0   Ectopic  0   Multiple  0   Live Births  1           History reviewed. No pertinent family history.  Social History   Tobacco Use  . Smoking status: Never Smoker  . Smokeless tobacco: Never Used  Substance Use Topics  . Alcohol use: No    Comment: occasional social drinking  . Drug use: No    Home Medications Prior to Admission medications   Medication Sig Start Date End Date Taking? Authorizing Provider  acetaminophen (TYLENOL) 500 MG tablet Take 1,000 mg by mouth every 6 (six) hours as needed for moderate pain.    [provider]  buPROPion (WELLBUTRIN XL) 150 MG 24 hr tablet Take 150 mg by mouth every morning. 09/22/19   [provider]  calcium carbonate (TUMS - DOSED IN MG ELEMENTAL CALCIUM) 500 MG chewable tablet Chew 1 tablet by  mouth daily as needed for heartburn.    [provider]  clonazePAM (KLONOPIN) 0.5 MG tablet Take 0.5 mg by mouth daily as needed. 08/25/19   [provider]  levonorgestrel (MIRENA, 52 MG,) 20 MCG/24HR IUD 1 Intra Uterine Device by Intrauterine route once.    [provider]  Prenatal Vit-Fe Fumarate-FA (PRENATAL MULTIVITAMIN) TABS tablet Take 1 tablet by mouth at bedtime.     [provider]  sodium chloride (OCEAN) 0.65 % SOLN nasal spray Place 1 spray into both nostrils as needed for congestion.    [provider]    Allergies    Patient has no known allergies.  Review of Systems   Review of Systems  Musculoskeletal: Positive for arthralgias, gait problem and joint swelling.  All other systems reviewed and are negative.   Physical Exam Updated Vital Signs BP (!) 136/92 (BP Location: Left Arm)   Pulse (!) 124   Temp 98.6 F (37 C) (Oral)   Resp 20   Ht 5\' 3"  (1.6 m)   Wt 111.1 kg   LMP 10/13/2019   SpO2 99%   BMI 43.40  kg/m   Physical Exam Constitutional:      Appearance: She is well-developed.  HENT:     Head: Normocephalic.     Nose: Nose normal.  Eyes:     General: Lids are normal.  Cardiovascular:     Rate and Rhythm: Normal rate.     Comments: 1+ DP and PT pulses in the left lower extremity.  Brisk cap refill distally. Pulmonary:     Effort: Pulmonary effort is normal. No respiratory distress.  Musculoskeletal:        General: Normal range of motion.     Cervical back: Normal range of motion.     Comments: Left knee: No focal bony tenderness to MCL, LCL, medial lateral joint line, patella, quad or patellar tendons, popliteal space.  Full range of motion of the knee without any pain.  No effusion.  No laxity.  Left ankle: No focal bony tenderness to medial or lateral malleoli, Achilles, calcaneus.  Full range of motion of the ankle without significant pain in the ankle however patient has pain in the left midfoot  area.  Left foot: Focal tenderness, edema along mid/proximal fourth and fifth metatarsals.  No focal bony tenderness to other metatarsals, MTPs or toes.  Neurological:     Mental Status: She is alert.     Comments: Sensation the left foot intact.  Intact big toe and ankle dorsiflexion and plantarflexion with resistance.  Psychiatric:        Behavior: Behavior normal.     ED Results / Procedures / Treatments   Labs (all labs ordered are listed, but only abnormal results are displayed) Labs Reviewed - No data to display  EKG None  Radiology DG Foot Complete Left  Result Date: 10/13/2019 CLINICAL DATA:  Left foot injury. EXAM: LEFT FOOT - COMPLETE 3+ VIEW COMPARISON:  No prior. FINDINGS: Slightly displaced fracture of the base of the left fifth metatarsal noted. No other focal abnormality identified. No radiopaque foreign body. IMPRESSION: Slightly displaced fracture of the base of the left fifth metatarsal. Electronically Signed   By: Maisie Fus  Register   On: 10/13/2019 12:51    Procedures Procedures (including critical care time)  Medications Ordered in ED Medications  acetaminophen (TYLENOL) tablet 1,000 mg (1,000 mg Oral Given 10/13/19 1317)    ED Course  I have reviewed the triage vital signs and the nursing notes.  Pertinent labs & imaging results that were available during my care of the patient were reviewed by me and considered in my medical decision making (see chart for details).  Clinical Course as of Oct 12 1330  Tue Oct 13, 2019  1254 Slightly displaced fracture of the base of the left fifth metatarsal.  DG Foot Complete Left [CG]    Clinical Course User Index [CG] Liberty Handy, PA-C   MDM Rules/Calculators/A&P                      ddx includes fracture of 4th/5th metatarsals vs soft tissue injury of foot/ankle like sprain.  Extremity is NVI.  Proximal and distal joints without injury.  No other physical injuries after fall.    Tylenol, ice,  elevation.   X-ray ordered.  1330: X-ray confirms slightly displaced fracture of the base of the left fifth metatarsal.  This fits clinical picture.  Patient placed in cam walker and given crutches.  Recommended nonweightbearing, elevation, ice, NSAIDs and follow-up with orthopedist in 7 to 10 days for reevaluation, possibly repeat x-rays.  Return  precautions given.  She is comfortable this.  Final Clinical Impression(s) / ED Diagnoses Final diagnoses:  Displaced fracture of fifth metatarsal bone, left foot, initial encounter for closed fracture    Rx / DC Orders ED Discharge Orders    None       Liberty HandyGibbons, Sayer Masini J, PA-C 10/13/19 1332    Bethann BerkshireZammit, Joseph, MD 10/14/19 [phone]

## 2019-10-13 NOTE — Discharge Instructions (Signed)
You have a fracture at the base of your fifth toe that is minimally displaced.  Initial treatment includes immobilization, rest, ice, elevation and anti-inflammatories.  Wear the cam walker boot for the next 7 to 10 days and until you see an orthopedist.  Try not to put weight on it.  Elevate your foot as much as possible.  You may remove the boot and put ice on the area of swelling.  For pain and inflammation you can use a combination of ibuprofen and acetaminophen.  Take [phone] mg acetaminophen (tylenol) every 6 hours or 600 mg ibuprofen (advil, motrin) every 6 hours.  You can take these separately or combine them every 6 hours for maximum pain control. Do not exceed 4,000 mg acetaminophen or 2,400 mg ibuprofen in a 24 hour period.  Do not take ibuprofen containing products if you have history of kidney disease, ulcers, GI bleeding, severe acid reflux, or take a blood thinner.  Do not take acetaminophen if you have liver disease.

## 2021-02-01 IMAGING — DX DG FOOT COMPLETE 3+V*L*
3 series · 3 of 3 positions shown · non-contrast
Comparison: No prior.

CLINICAL DATA: Left foot injury.

EXAM:
LEFT FOOT - COMPLETE 3+ VIEW

[foot ap]
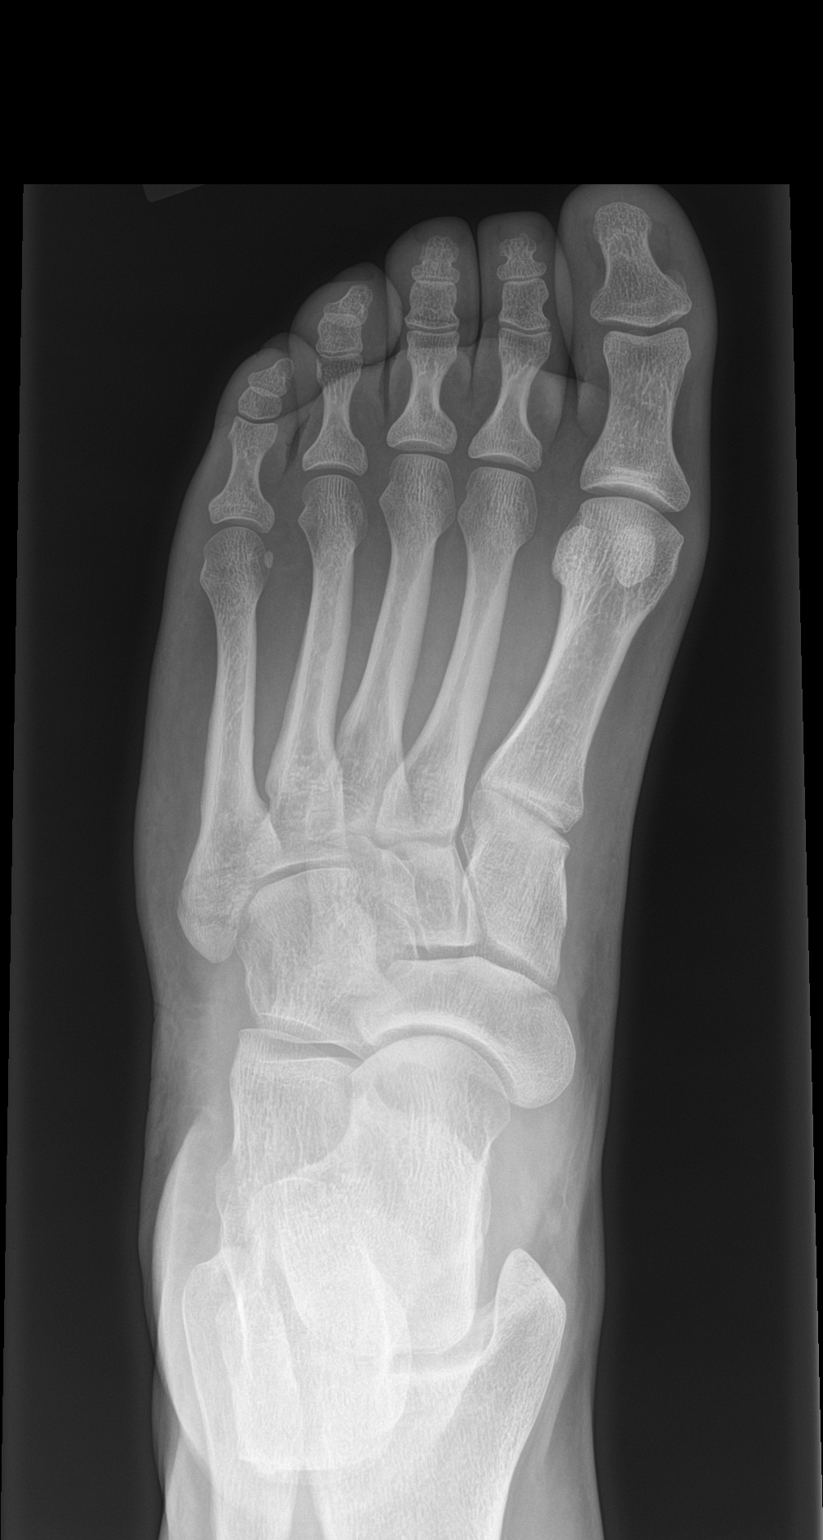

[foot obl]
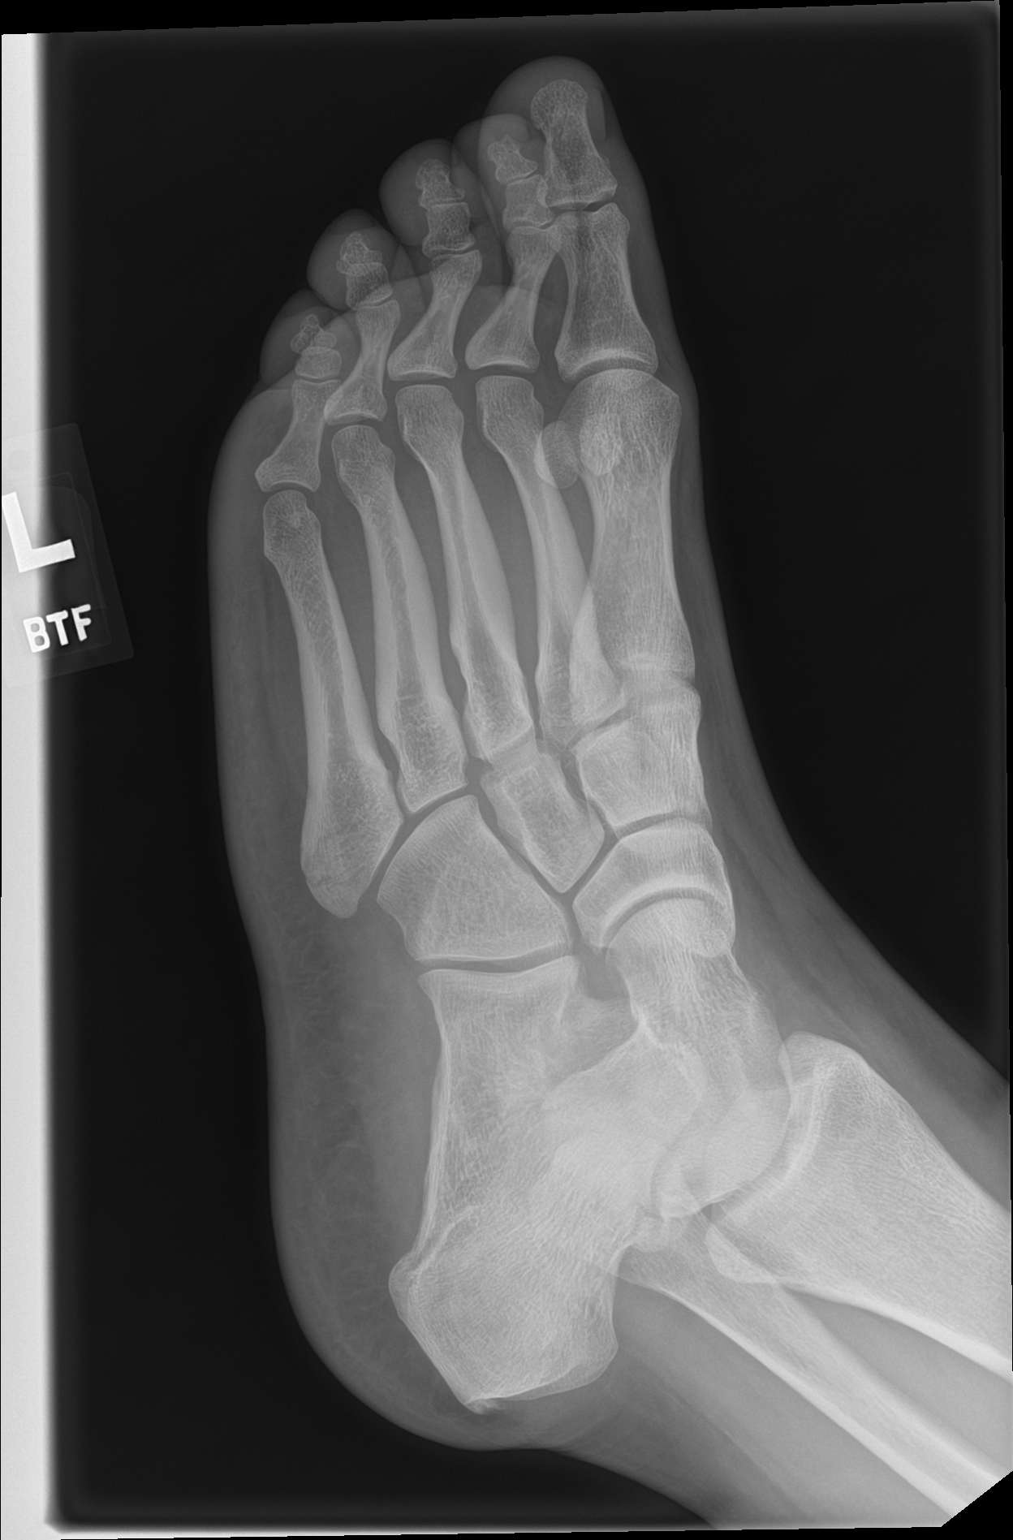

[foot lat]
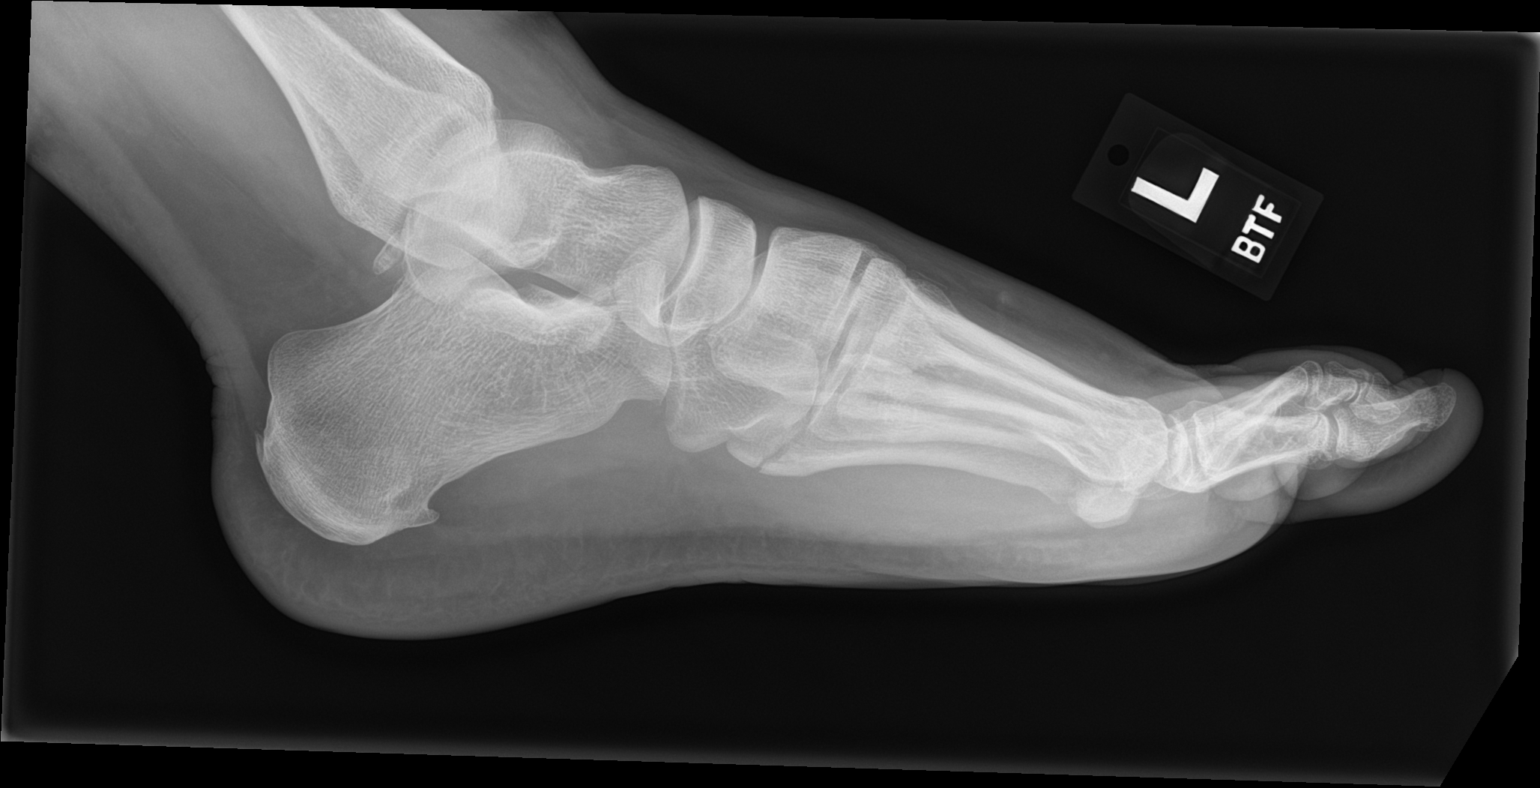

[3 of 3 positions shown; findings below may reference images not displayed]

FINDINGS: Slightly displaced fracture of the base of the left fifth metatarsal
noted. No other focal abnormality identified. No radiopaque foreign
body.
IMPRESSION: Slightly displaced fracture of the base of the left fifth
metatarsal.

## 2022-10-31 ENCOUNTER — Inpatient Hospital Stay: Payer: Self-pay | Attending: Hematology and Oncology | Admitting: Hematology and Oncology

## 2022-10-31 VITALS — BP 126/90 | Wt 216.0 lb

## 2022-10-31 DIAGNOSIS — N6324 Unspecified lump in the left breast, lower inner quadrant: Secondary | ICD-10-CM

## 2022-10-31 NOTE — Patient Instructions (Signed)
[name] about BSE and gave educational materials to take home. Patient did not need a Pap smear today due to last Pap smear was in 09/2022 per patient. Told patient about free cervical cancer screenings to receive a Pap smear if would like one next year. Let her know [facility] will cover Pap smears every 3 years unless has a history of abnormal Pap smears. [facility]. Patient aware of appointment and will be there. Let patient know will follow up with her within the next couple weeks with results. Katelyn Peters verbalized understanding.  Melodye Ped, NP 11:35 AM

## 2022-10-31 NOTE — Progress Notes (Signed)
Ms. Katelyn Peters is a 37 y.o. female who presents to [facility] clinic today with complaint of lumps noted to left breast since weight loss.    Pap Smear: Pap not smear completed today. Last Pap smear was 10-12-2022 at [facility] and was  normal with +HPV (negative 16/18) . Per patient has no history of an abnormal Pap smear. Last Pap smear result is available in Epic. She understands she needs follow up Pap smear in one year.    Physical exam: Breasts Breasts symmetrical. No skin abnormalities bilateral breasts. No nipple retraction bilateral breasts. No nipple discharge bilateral breasts. No lymphadenopathy. No lump palpated right breasts. Left breast with several cysts noted to the lower inner quadrant.       Pelvic/Bimanual Pap is not indicated today    Smoking History: Patient has never smoked and was not referred to quit line.    Patient Navigation: Patient education provided. Access to services provided for patient through [facility] program. No interpreter provided. No transportation provided   Colorectal Cancer Screening: Per patient has never had colonoscopy completed No complaints today.    Breast and Cervical Cancer Risk Assessment: Patient does not have family history of breast cancer, known genetic mutations, or radiation treatment to the chest before age 3. Patient does not have history of cervical dysplasia, immunocompromised, or DES exposure in-utero.  Risk Scores as of 10/31/2022     Katelyn Peters           5-year 0.36 %   Lifetime 12.38 %            Last calculated by Claretha Cooper, CMA on 10/31/2022 at 11:14 AM        A: [facility] exam without pap smear Complaint of breast changes since significant weight loss. She describes her breasts as feeling "lumpy" since losing weight. She denies pain.   P: [facility]. Appointment scheduled.  Melodye Ped, NP 10/31/2022 11:26 AM

## 2022-11-12 ENCOUNTER — Encounter: Payer: Self-pay | Admitting: Hematology and Oncology

## 2022-11-16 ENCOUNTER — Encounter: Payer: Self-pay | Admitting: Hematology and Oncology

## 2024-04-09 ENCOUNTER — Ambulatory Visit (HOSPITAL_BASED_OUTPATIENT_CLINIC_OR_DEPARTMENT_OTHER)
Admission: RE | Admit: 2024-04-09 | Discharge: 2024-04-09 | Disposition: A | Discharge status: Home / Self Care | Payer: Self-pay | Source: Ambulatory Visit

## 2024-04-09 ENCOUNTER — Encounter (HOSPITAL_BASED_OUTPATIENT_CLINIC_OR_DEPARTMENT_OTHER): Payer: Self-pay

## 2024-04-09 ENCOUNTER — Ambulatory Visit (INDEPENDENT_AMBULATORY_CARE_PROVIDER_SITE_OTHER): Payer: Self-pay | Admitting: Radiology

## 2024-04-09 ENCOUNTER — Ambulatory Visit (HOSPITAL_BASED_OUTPATIENT_CLINIC_OR_DEPARTMENT_OTHER): Payer: Self-pay | Admitting: Radiology

## 2024-04-09 VITALS — BP 151/94 | HR 113 | Temp 99.2°F | Resp 20

## 2024-04-09 DIAGNOSIS — R0789 Other chest pain: Secondary | ICD-10-CM

## 2024-04-09 DIAGNOSIS — M7989 Other specified soft tissue disorders: Secondary | ICD-10-CM

## 2024-04-09 NOTE — ED Triage Notes (Signed)
 States feeling chest tightness  due to her asthma and has used her inhaler a few times as well as nebulizer machine.  States usually after using inhaler, she feels jittery and heart races. States feeling more intense symptoms for far longer than usual. Feels as if her heart continues to race then beat normally. Also reporting right knee discomfort. States feels as if there is band compressing on the back of her leg. Compression socks are helpful. No recent travel. 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

## 2024-04-09 NOTE — Discharge Instructions (Signed)
 Your chest x-ray was negative for any infection.  Your DVT study was negative for any blood clot.  You may have some sort of virus.  I would recommend if your symptoms worsen or you become more short of breath or have increased heart rate or worsening symptoms to go to the ER.

## 2024-04-10 NOTE — ED Provider Notes (Signed)
 [facility]    CSN: [id number] Arrival date & time: 04/09/24  1411      History   Chief Complaint Chief Complaint  Patient presents with   Dizziness    Have been feeling dizzy for a couple days. Heart feels like it pounding fast sometimes and then stopping. Lungs feel tight sometimes. No fever or pain. - Entered by patient    HPI Katelyn Peters is a 38 y.o. female.   Patient is a 38 year old female that presents today for dizziness, leg swelling, chest tightness just overall not feeling well.  States feeling chest tightness  due to her asthma and has used her inhaler a few times as well as nebulizer machine.  Feels like the albuterol is not helping like typical.  No cough or chest congestion.  States usually after using inhaler, she feels jittery and heart races. States feeling more intense symptoms for far longer than usual. Feels as if her heart continues to race then beat normally. Also reporting right leg  discomfort. States feels as if there is band compressing on the back of her leg. Compression socks are helpful. No recent travel.  No recent surgeries and she is not on birth control.  She does have low-grade fever here today and is mildly tachycardic.   Dizziness   Past Medical History:  Diagnosis Date   Anxiety    Medical history non-contributory     Patient Active Problem List   Diagnosis Date Noted   Status post normal vaginal delivery 10/06/2014   Hypertension affecting pregnancy 10/04/2014    Past Surgical History:  Procedure Laterality Date   NO PAST SURGERIES      OB History     Gravida  1   Para  1   Term  1   Preterm  0   AB  0   Living  1      SAB  0   IAB  0   Ectopic  0   Multiple  0   Live Births  1            Home Medications    Prior to Admission medications   Medication Sig Start Date End Date Taking? Authorizing Provider  ALPRAZolam (XANAX) 0.25 MG tablet Take 0.25 mg by mouth 2 (two) times daily as  needed for anxiety.   Yes [provider]  esomeprazole (NEXIUM) 20 MG capsule Take 20 mg by mouth daily at 12 noon.   Yes [provider]  acetaminophen  (TYLENOL ) 500 MG tablet Take 1,000 mg by mouth every 6 (six) hours as needed for moderate pain.    [provider]  albuterol (VENTOLIN HFA) 108 (90 Base) MCG/ACT inhaler Inhale into the lungs. 08/05/20   [provider]  calcium carbonate (TUMS - DOSED IN MG ELEMENTAL CALCIUM) 500 MG chewable tablet Chew 1 tablet by mouth daily as needed for heartburn.    [provider]  cholecalciferol (VITAMIN D3) 25 MCG (1000 UNIT) tablet Take 1,000 Units by mouth daily.    [provider]  clonazePAM (KLONOPIN) 0.5 MG tablet Take 0.5 mg by mouth daily as needed. 08/25/19   [provider]  Lactobacillus Rhamnosus, GG, (CULTURELLE) CAPS Take 1 capsule by mouth daily.    [provider]  sodium chloride  (OCEAN) 0.65 % SOLN nasal spray Place 1 spray into both nostrils as needed for congestion.    [provider]    Family History History reviewed. No pertinent family history.  Social History Social History   Tobacco Use   Smoking status: Never   Smokeless tobacco: Never  Vaping Use   Vaping status: Never Used  Substance Use Topics   Alcohol use: No    Comment: occasional social drinking   Drug use: No     Allergies   Patient has no known allergies.   Review of Systems Review of Systems  Neurological:  Positive for dizziness.   See HPI  Physical Exam Triage Vital Signs ED Triage Vitals  Encounter Vitals Group     BP 04/09/24 1445 (!) 151/94     Girls Systolic BP Percentile --      Girls Diastolic BP Percentile --      Boys Systolic BP Percentile --      Boys Diastolic BP Percentile --      Pulse Rate 04/09/24 1445 (!) 113     Resp 04/09/24 1445 20     Temp 04/09/24 1445 99.2 F (37.3 C)     Temp Source 04/09/24 1445 Oral     SpO2 04/09/24 1445 98 %      Weight --      Height --      Head Circumference --      Peak Flow --      Pain Score 04/09/24 1446 0     Pain Loc --      Pain Education --      Exclude from Growth Chart --    No data found.  Updated Vital Signs BP (!) 151/94 (BP Location: Right Arm)   Pulse (!) 113   Temp 99.2 F (37.3 C) (Oral)   Resp 20   LMP 03/30/2024   SpO2 98%   Visual Acuity Right Eye Distance:   Left Eye Distance:   Bilateral Distance:    Right Eye Near:   Left Eye Near:    Bilateral Near:     Physical Exam Vitals and nursing note reviewed.  Constitutional:      General: She is not in acute distress.    Appearance: Normal appearance. She is not ill-appearing, toxic-appearing or diaphoretic.  HENT:     Mouth/Throat:     Pharynx: Oropharynx is clear.   Eyes:     Conjunctiva/sclera: Conjunctivae normal.    Cardiovascular:     Rate and Rhythm: Regular rhythm. Tachycardia present.  Pulmonary:     Effort: Pulmonary effort is normal.     Breath sounds: Normal breath sounds.   Musculoskeletal:        General: Normal range of motion.     Cervical back: Normal range of motion.     Right lower leg: Edema present.     Comments: Significant swelling to the RLE compared to the right. No specific calf pain. Pain more in the knee. No increased warmth, erythema.    Skin:    General: Skin is warm and dry.   Neurological:     Mental Status: She is alert.   Psychiatric:        Mood and Affect: Mood normal.      UC Treatments / Results  Labs (all labs ordered are listed, but only abnormal results are displayed) Labs Reviewed - No data to display  EKG   Radiology DG Chest 2 View Result Date: 04/09/2024 CLINICAL DATA:  Chest tightness. EXAM: CHEST - 2 VIEW COMPARISON:  None Available. FINDINGS: The heart size and mediastinal contours are within normal limits. Both lungs are clear. No pleural effusion or pneumothorax.  The visualized skeletal structures are unremarkable. IMPRESSION:  No active cardiopulmonary disease. Electronically Signed   By: Mannie Seek M.D.   On: 04/09/2024 16:16   US  Venous Img Lower Unilateral Right Result Date: 04/09/2024 CLINICAL DATA:  Lower extremity swelling in posterior calf tightness. EXAM: RIGHT LOWER EXTREMITY VENOUS DOPPLER ULTRASOUND TECHNIQUE: Gray-scale sonography with compression, as well as color and duplex ultrasound, were performed to evaluate the deep venous system(s) from the level of the common femoral vein through the popliteal and proximal calf veins. COMPARISON:  None available FINDINGS: VENOUS Normal compressibility of the common femoral, superficial femoral, and popliteal veins, as well as the visualized calf veins. Visualized portions of profunda femoral vein and great saphenous vein unremarkable. No filling defects to suggest DVT on grayscale or color Doppler imaging. Doppler waveforms show normal direction of venous flow, normal respiratory plasticity and response to augmentation. Limited views of the contralateral common femoral vein are unremarkable. OTHER None. Limitations: none IMPRESSION: No right lower extremity DVT. Electronically Signed   By: Elester Grim M.D.   On: 04/09/2024 15:27    Procedures Procedures (including critical care time)  Medications Ordered in UC Medications - No data to display  Initial Impression / Assessment and Plan / UC Course  I have reviewed the triage vital signs and the nursing notes.  Pertinent labs & imaging results that were available during my care of the patient were reviewed by me and considered in my medical decision making (see chart for details).     Leg swelling- was concerned for DVT. US  negative. Not sure of cause of the swelling. Reports this leg is always bigger than the left leg. Pain behind the knee. ? Dependent edema, lymphedema. Continue to wear the compression stockings and follow up with her PCP as needed  Chest tightness- was concerned for PE initially but DVT was  negative. Chest x ray was negative. She is mildly anxious about what is going on. Hx of Asthma and not relieved with albuterol. Lungs clear on exam. She does have a low grade fever. ? Viral illness. We will have her monitor and follow up if worse.  ER precautions given.  Final Clinical Impressions(s) / UC Diagnoses   Final diagnoses:  Leg swelling  Chest tightness     Discharge Instructions      Your chest x-ray was negative for any infection.  Your DVT study was negative for any blood clot.  You may have some sort of virus.  I would recommend if your symptoms worsen or you become more short of breath or have increased heart rate or worsening symptoms to go to the ER.     ED Prescriptions   None    PDMP not reviewed this encounter.   Landa Pine, FNP 04/10/24 [phone]
